# Patient Record
Sex: Male | Born: 1950
Health system: Southern US, Community
[De-identification: ages and names within clinical notes are randomized; demographics above are authoritative.]

## PROBLEM LIST (undated history)

## (undated) DIAGNOSIS — S4380XA Sprain of other specified parts of unspecified shoulder girdle, initial encounter: Secondary | ICD-10-CM

## (undated) DIAGNOSIS — I10 Essential (primary) hypertension: Secondary | ICD-10-CM

## (undated) DIAGNOSIS — K219 Gastro-esophageal reflux disease without esophagitis: Secondary | ICD-10-CM

## (undated) DIAGNOSIS — E0590 Thyrotoxicosis, unspecified without thyrotoxic crisis or storm: Secondary | ICD-10-CM

## (undated) HISTORY — PX: EYE SURGERY: SHX253

## (undated) HISTORY — DX: Gastro-esophageal reflux disease without esophagitis: K21.9

## (undated) HISTORY — DX: Essential (primary) hypertension: I10

## (undated) HISTORY — DX: Thyrotoxicosis, unspecified without thyrotoxic crisis or storm: E05.90

## (undated) HISTORY — DX: Sprain of other specified parts of unspecified shoulder girdle, initial encounter: S43.80XA

## (undated) HISTORY — PX: TONSILLECTOMY: SUR1361

---

## 2009-05-12 ENCOUNTER — Emergency Department (HOSPITAL_COMMUNITY): Admission: EM | Admit: 2009-05-12 | Discharge: 2009-05-12 | Payer: Self-pay | Admitting: Emergency Medicine

## 2010-05-03 ENCOUNTER — Emergency Department (HOSPITAL_COMMUNITY): Admission: EM | Admit: 2010-05-03 | Discharge: 2010-05-03 | Payer: Self-pay | Admitting: Emergency Medicine

## 2010-05-04 ENCOUNTER — Emergency Department (HOSPITAL_COMMUNITY): Admission: EM | Admit: 2010-05-04 | Discharge: 2010-05-04 | Payer: Self-pay | Admitting: Emergency Medicine

## 2010-10-25 LAB — URINALYSIS, ROUTINE W REFLEX MICROSCOPIC
Leukocytes, UA: NEGATIVE
Nitrite: NEGATIVE
Urobilinogen, UA: 0.2 mg/dL (ref 0.0–1.0)

## 2010-10-25 LAB — URINE MICROSCOPIC-ADD ON

## 2010-11-15 LAB — CBC
Hemoglobin: 12.1 g/dL — ABNORMAL LOW (ref 13.0–17.0)
MCHC: 34.3 g/dL (ref 30.0–36.0)
Platelets: 173 10*3/uL (ref 150–400)
RDW: 14.1 % (ref 11.5–15.5)

## 2010-11-15 LAB — DIFFERENTIAL
Basophils Absolute: 0 10*3/uL (ref 0.0–0.1)
Basophils Relative: 0 % (ref 0–1)
Eosinophils Absolute: 0.1 10*3/uL (ref 0.0–0.7)
Eosinophils Relative: 3 % (ref 0–5)
Lymphocytes Relative: 37 % (ref 12–46)
Monocytes Absolute: 0.7 10*3/uL (ref 0.1–1.0)
Monocytes Relative: 15 % — ABNORMAL HIGH (ref 3–12)
Neutrophils Relative %: 46 % (ref 43–77)

## 2010-11-15 LAB — COMPREHENSIVE METABOLIC PANEL
ALT: 29 U/L (ref 0–53)
Albumin: 3.6 g/dL (ref 3.5–5.2)
Alkaline Phosphatase: 47 U/L (ref 39–117)
Calcium: 9 mg/dL (ref 8.4–10.5)
Chloride: 104 mEq/L (ref 96–112)
Creatinine, Ser: 1.05 mg/dL (ref 0.4–1.5)
Glucose, Bld: 131 mg/dL — ABNORMAL HIGH (ref 70–99)
Potassium: 3.9 mEq/L (ref 3.5–5.1)
Sodium: 142 mEq/L (ref 135–145)
Total Bilirubin: 0.7 mg/dL (ref 0.3–1.2)

## 2010-11-15 LAB — URINALYSIS, ROUTINE W REFLEX MICROSCOPIC: Hgb urine dipstick: NEGATIVE

## 2010-11-15 LAB — LIPASE, BLOOD: Lipase: 22 U/L (ref 11–59)

## 2011-05-29 ENCOUNTER — Other Ambulatory Visit: Payer: Self-pay | Admitting: Family Medicine

## 2011-05-29 ENCOUNTER — Encounter: Payer: Self-pay | Admitting: Family Medicine

## 2011-05-29 ENCOUNTER — Ambulatory Visit
Admission: RE | Admit: 2011-05-29 | Discharge: 2011-05-29 | Disposition: A | Discharge status: Home / Self Care | Payer: BC Managed Care – PPO | Source: Ambulatory Visit | Attending: Family Medicine | Admitting: Family Medicine

## 2011-05-29 ENCOUNTER — Inpatient Hospital Stay (INDEPENDENT_AMBULATORY_CARE_PROVIDER_SITE_OTHER)
Admission: RE | Admit: 2011-05-29 | Discharge: 2011-05-29 | Disposition: A | Discharge status: Home / Self Care | Payer: BC Managed Care – PPO | Source: Ambulatory Visit | Attending: Family Medicine | Admitting: Family Medicine

## 2011-05-29 DIAGNOSIS — S9000XA Contusion of unspecified ankle, initial encounter: Secondary | ICD-10-CM

## 2011-07-15 NOTE — Progress Notes (Signed)
Summary: Right ankle Pain Room 2   Vital Signs:  Patient Profile:   60 Years Old Male CC:      Rt ankle injury, dropped mower on it x 5 days ago Height:     68 inches Weight:      165 pounds O2 Sat:      98 % O2 treatment:    Room Air Temp:     98.2 degrees F oral Pulse rate:   64 / minute Pulse rhythm:   regular Resp:     16 per minute BP sitting:   145 / 88  (left arm) Cuff size:   regular  Vitals Entered By: Emilio Math (May 29, 2011 10:37 AM)                  Current Allergies: No known allergies History of Present Illness Chief Complaint: Rt ankle injury, dropped mower on it x 4 days ago History of Present Illness:  Subjective:  Patient was working on a PTO driven mower deck 4 days ago while it was propped on its edge.  The deck fell over and struck the medial aspect of his right ankle.  He has had persistent swelling/soreness although it is improving.  He had a small abrasion at the point of contact.  His last tetanus shot was 4 to 5 years ago.  Current Meds OMEPRAZOLE 10 MG CPDR (OMEPRAZOLE)   REVIEW OF SYSTEMS       Musculoskeletal       Complains of muscle pain, joint pain, joint stiffness, and decreased range of motion.   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  Tenderness and mild swelling over the medial malleolus.  There is a superficial abrasion over the medial malleolus without drainage or evidence of infection.  Joint stable.  No tenderness over the base of the fifth  metatarsal.  Distal neurovascular intact.  x-ray right ankle:  no fracture  Assessment New Problems: CONTUSION, ANKLE (ICD-924.21)  NO EVIDENCE INFECTION TODAY  Plan New Orders: T-DG Ankle  Complete*R* [73610] Ace Wraps 3-5 in/yard  [E4540] New Patient Level III [98119] Planning Comments:   Bacitracin and bandage applied to abrasion.  Ace wrap applied to foot and ankle. Recommend changing bandage daily with Bacitracin until healed.  Wear ace wrap daytime until healed. Return for any signs of infection   The patient and/or caregiver has been counseled thoroughly with regard to medications prescribed including dosage, schedule, interactions, rationale for use, and possible side effects and they verbalize understanding.  Diagnoses and expected course of recovery discussed and will return if not improved as expected or if the condition worsens. Patient and/or caregiver verbalized understanding.   Orders Added: 1)  T-DG Ankle Complete*R* [73610] 2)  Ace Wraps 3-5 in/yard  [A6449] 3)  New Patient Level III [14782]

## 2011-07-30 ENCOUNTER — Emergency Department
Admission: EM | Admit: 2011-07-30 | Discharge: 2011-07-30 | Disposition: A | Discharge status: Home / Self Care | Payer: BC Managed Care – PPO | Source: Home / Self Care | Attending: Family Medicine | Admitting: Family Medicine

## 2011-07-30 ENCOUNTER — Emergency Department
Admit: 2011-07-30 | Discharge: 2011-07-30 | Disposition: A | Discharge status: Home / Self Care | Payer: BC Managed Care – PPO | Attending: Family Medicine | Admitting: Family Medicine

## 2011-07-30 ENCOUNTER — Encounter: Payer: Self-pay | Admitting: *Deleted

## 2011-07-30 DIAGNOSIS — M25559 Pain in unspecified hip: Secondary | ICD-10-CM

## 2011-07-30 DIAGNOSIS — M25551 Pain in right hip: Secondary | ICD-10-CM

## 2011-07-30 NOTE — ED Notes (Addendum)
Patient was seen here in [facility] a couple months ago for pain after a fall. He now has intermittent right hip pain and decreased ROM with his right hip.

## 2011-07-30 NOTE — ED Provider Notes (Signed)
History     CSN: [id number] Arrival date & time: 07/30/2011  4:19 PM   First MD Initiated Contact with Patient 07/30/11 1636      Chief Complaint  Patient presents with  . Hip Pain    right     HPI Comments: Patient injured his right ankle about two months ago when a PTO driven mower deck fell on it.  He fell to the ground, landing on his right hip and buttock.  He had no significant pain in his right hip/buttock at that time, but does recall noticing bruising in that area. Over the past two weeks he has developed stiffness in his right hip, most noticeable when he attempts to raise his right foot after a shower to towel dry his right foot.  He also has some mild discomfort when arising from a chair.  Patient is a 59 y.o. male presenting with hip pain. The history is provided by the patient.  Hip Pain This is a new problem. The current episode started more than 1 week ago. The problem occurs daily. The problem has been gradually worsening. The symptoms are aggravated by bending. The symptoms are relieved by nothing. He has tried nothing for the symptoms.    History reviewed. No pertinent past medical history.  History reviewed. No pertinent past surgical history.  History reviewed. No pertinent family history.  History  Substance Use Topics  . Smoking status: Never Smoker   . Smokeless tobacco: Not on file  . Alcohol Use: No      Review of Systems  All other systems reviewed and are negative.    Allergies  Review of patient's allergies indicates no known allergies.  Home Medications  No current outpatient prescriptions on file.  BP 148/84  Pulse 68  Temp(Src) 98.3 F (36.8 C) (Oral)  Resp 14  Ht 5\' 7"  (1.702 m)  Wt 167 lb (75.751 kg)  BMI 26.16 kg/m2  SpO2 98%  Physical Exam  Constitutional: He appears well-developed and well-nourished. No distress.  Eyes: Pupils are equal, round, and reactive to light.  Cardiovascular: Normal rate and normal heart sounds.    Pulmonary/Chest: Breath sounds normal.  Musculoskeletal:       Right hip: He exhibits decreased range of motion. He exhibits normal strength, no tenderness, no bony tenderness, no swelling, no crepitus and no deformity.       There is no tenderness to palpation in the right hip/buttock area.  There is no tenderness in the right inguinal and pubic symphysis area. Right hip has decreased internal/external rotation, but normal flexion/extension.    ED Course  Procedures  none  Narrative: *RADIOLOGY REPORT*  Clinical Data: Development of right hip pain after an injury approximately 2 months ago.  RIGHT HIP - COMPLETE 2+ VIEW 07/30/2011:  Comparison: None.  Findings: No evidence of acute or subacute fracture or dislocation. Joint space well-preserved. No intrinsic osseous abnormalities. No visible joint effusion.  Included AP pelvis demonstrates a normal appearing contralateral left hip. Sacroiliac joints and symphysis pubis intact. No fractures elsewhere involving the bony pelvis. Visualized lower lumbar spine unremarkable. Surgical clips noted in the scrotum.  IMPRESSION: Normal examination.  Original Report Authenticated By: Arnell Sieving, M.D.      1. Right hip pain       MDM  Unremarkable exam and hip x-ray today. Begin range of motion exercises ([facility] information and instruction handout given)  Followup with [facility] if not improving about two weeks.  Donna Christen, MD 08/04/11 2221

## 2011-08-05 DIAGNOSIS — M25559 Pain in unspecified hip: Secondary | ICD-10-CM

## 2013-02-12 ENCOUNTER — Emergency Department (INDEPENDENT_AMBULATORY_CARE_PROVIDER_SITE_OTHER): Payer: No Typology Code available for payment source

## 2013-02-12 ENCOUNTER — Emergency Department
Admission: EM | Admit: 2013-02-12 | Discharge: 2013-02-12 | Disposition: A | Discharge status: Home / Self Care | Payer: No Typology Code available for payment source | Source: Home / Self Care | Attending: Family Medicine | Admitting: Family Medicine

## 2013-02-12 DIAGNOSIS — S8392XA Sprain of unspecified site of left knee, initial encounter: Secondary | ICD-10-CM

## 2013-02-12 DIAGNOSIS — IMO0002 Reserved for concepts with insufficient information to code with codable children: Secondary | ICD-10-CM

## 2013-02-12 DIAGNOSIS — X500XXA Overexertion from strenuous movement or load, initial encounter: Secondary | ICD-10-CM

## 2013-02-12 DIAGNOSIS — M25469 Effusion, unspecified knee: Secondary | ICD-10-CM

## 2013-02-12 MED ORDER — HYDROCODONE-ACETAMINOPHEN 5-325 MG PO TABS: ORAL_TABLET | ORAL | Status: DC

## 2013-02-12 NOTE — ED Provider Notes (Signed)
History    CSN: [id number] Arrival date & time 02/12/13  2725  First MD Initiated Contact with Patient 02/12/13 [phone]     Chief Complaint  Patient presents with  . Knee Pain    x 1 day      HPI Comments: Patient complains of recurring pain in his right knee for about a year.  Last night while playing tag with grandkids, he fell and twisted his right knee.  He is having persistent pain medially.  Patient is a 62 y.o. male presenting with knee pain. The history is provided by the patient.  Knee Pain Location:  Knee Time since incident:  1 day Injury: yes   Mechanism of injury: fall   Fall:    Fall occurred:  Recreating/playing   Impact surface:  Grass   Point of impact:  Unable to specify Knee location:  L knee Pain details:    Quality:  Sharp   Radiates to:  Does not radiate   Severity:  Moderate   Onset quality:  Sudden   Duration:  1 day   Timing:  Constant   Progression:  Unchanged Chronicity:  New Dislocation: no   Prior injury to area:  No Relieved by:  Nothing Worsened by:  Bearing weight Ineffective treatments:  NSAIDs Associated symptoms: decreased ROM and stiffness   Associated symptoms: no back pain, no muscle weakness, no numbness, no swelling and no tingling    History reviewed. No pertinent past medical history. History reviewed. No pertinent past surgical history. History reviewed. No pertinent family history. History  Substance Use Topics  . Smoking status: Never Smoker   . Smokeless tobacco: Not on file  . Alcohol Use: No    Review of Systems  Musculoskeletal: Positive for stiffness. Negative for back pain.  All other systems reviewed and are negative.    Allergies  Review of patient's allergies indicates no known allergies.  Home Medications   Current Outpatient Rx  Name  Route  Sig  Dispense  Refill  . omeprazole (PRILOSEC) 20 MG capsule   Oral   Take 20 mg by mouth daily.         Marland Kitchen HYDROcodone-acetaminophen (NORCO/VICODIN) 5-325  MG per tablet      Take one by mouth at bedtime as needed for pain   10 tablet   0    BP 159/92  Pulse 84  Temp(Src) 98 F (36.7 C) (Oral)  Ht 5' 7.5" (1.715 m)  Wt 165 lb (74.844 kg)  BMI 25.45 kg/m2  SpO2 95% Physical Exam  Nursing note and vitals reviewed. Constitutional: He is oriented to person, place, and time. He appears well-developed and well-nourished. No distress.  HENT:  Head: Atraumatic.  Eyes: Conjunctivae are normal. Pupils are equal, round, and reactive to light.  Musculoskeletal: He exhibits tenderness.       Left knee: He exhibits decreased range of motion, bony tenderness and abnormal meniscus. He exhibits no swelling, no effusion, no ecchymosis, no deformity, no laceration, no erythema, normal alignment, no LCL laxity, normal patellar mobility and no MCL laxity. Tenderness found. Medial joint line and MCL tenderness noted. No lateral joint line, no LCL and no patellar tendon tenderness noted.  Left knee is stable.  There is distinct tenderness over the medial joint line and MCL.  McMurray test is questionably positive for medial meniscus.  Distal neurovascular function is intact.   Neurological: He is alert and oriented to person, place, and time.  Skin: Skin is warm and  dry. No erythema.    ED Course  Procedures  none Labs Reviewed -       DG Knee Complete 4 Views Left (Final result)  Result time: 02/12/13 10:26:57    Final result by Rad Results In Interface (02/12/13 10:26:57)    Narrative:   *RADIOLOGY REPORT*  Clinical Data: Left knee pain. Twisting injury.  LEFT KNEE - COMPLETE 4+ VIEW  Comparison: None.  Findings: Small left knee effusion suspected on the lateral projection. Mild patellar spurring noted. There is mild spurring of the tibial spine and medial tibial plateau.  No discrete fracture identified.  IMPRESSION:  1. Small knee effusion without discrete fracture identified. If symptoms persist despite conservative therapy, MRI  followup may be warranted.   Original Report Authenticated By: Gaylyn Rong, M.D.     1. Left knee sprain, initial encounter; ?MCL sprain, ?meniscus injury     MDM  Dispensed crutches. Use crutches for about 5 to 7 days.  Wear ace wrap (patient has one at home).  Apply ice pack for 30 to 45 minutes every 1 to 4 hours.  Continue until swelling decreases.  May take Aleve, two tabs every 12 hours.  Begin knee exercises as per instruction sheet ([facility] information and instruction handout given)  Rx written for Lortab at bedtime. Follow-up with Dr. Tyson Babinski, MD 02/14/13 2220

## 2013-02-12 NOTE — Discharge Instructions (Signed)
Use crutches for about 5 to 7 days.  Wear ace wrap.  Apply ice pack for 30 to 45 minutes every 1 to 4 hours.  Continue until swelling decreases.  May take Aleve, two tabs every 12 hours.  Begin knee exercises as per instruction sheet.

## 2013-02-12 NOTE — ED Notes (Signed)
Robert West complains of left knee pain for 1 day. The pain is a 10/10 and is a stabbing - aching pain. He was playing with his grand kids yesterday and believes he twisted his knee.

## 2013-02-18 ENCOUNTER — Telehealth: Payer: Self-pay | Admitting: Emergency Medicine

## 2013-04-15 ENCOUNTER — Encounter: Payer: Self-pay | Admitting: Sports Medicine

## 2013-04-15 ENCOUNTER — Ambulatory Visit (INDEPENDENT_AMBULATORY_CARE_PROVIDER_SITE_OTHER): Payer: No Typology Code available for payment source | Admitting: Sports Medicine

## 2013-04-15 VITALS — BP 146/85 | HR 74 | Wt 163.0 lb

## 2013-04-15 DIAGNOSIS — M25562 Pain in left knee: Secondary | ICD-10-CM

## 2013-04-15 DIAGNOSIS — M170 Bilateral primary osteoarthritis of knee: Secondary | ICD-10-CM | POA: Insufficient documentation

## 2013-04-15 DIAGNOSIS — M25569 Pain in unspecified knee: Secondary | ICD-10-CM

## 2013-04-15 NOTE — Progress Notes (Signed)
   Subjective:    I'm seeing this patient as a consultation for:  Dr. Cathren Harsh  CC: Left knee pain  HPI: This is a pleasant 62 year old male, unfortunately over July 4 weekend he twisted his left knee, he had a swelling, but did develop pain for the next few days. He rested the knee and wore a brace for approximately 2 months but unfortunately has continued to have pain he localizes along the medial joint line, without radiation, no swelling, no locking, no other mechanical symptoms. He tried some oral analgesics without any improvement. He was seen in urgent care where x-rays showed a mild effusion. He was referred to me for definitive evaluation and treatment.  Past medical history, Surgical history, Family history not pertinant except as noted below, Social history, Allergies, and medications have been entered into the medical record, reviewed, and no changes needed.   Review of Systems: No headache, visual changes, nausea, vomiting, diarrhea, constipation, dizziness, abdominal pain, skin rash, fevers, chills, night sweats, weight loss, swollen lymph nodes, body aches, joint swelling, muscle aches, chest pain, shortness of breath, mood changes, visual or auditory hallucinations.   Objective:   General: Well Developed, well nourished, and in no acute distress.  Neuro/Psych: Alert and oriented x3, extra-ocular muscles intact, able to move all 4 extremities, sensation grossly intact. Skin: Warm and dry, no rashes noted.  Respiratory: Not using accessory muscles, speaking in full sentences, trachea midline.  Cardiovascular: Pulses palpable, no extremity edema. Abdomen: Does not appear distended. Left Knee: Normal to inspection with no erythema or effusion or obvious bony abnormalities. Tender to palpation at the medial joint line. ROM full in flexion and extension and lower leg rotation. Ligaments with solid consistent endpoints including ACL, PCL, LCL, MCL. Negative Mcmurray's, Apley's, and  Thessalonian tests. Pain with extreme flexion suggesting meniscal injury. Non painful patellar compression. Patellar glide without crepitus. Patellar and quadriceps tendons unremarkable. Hamstring and quadriceps strength is normal.   Procedure: Real-time Ultrasound Guided Injection of left knee Device: GE Logiq E  Verbal informed consent obtained.  Time-out conducted.  Noted no overlying erythema, induration, or other signs of local infection.  Skin prepped in a sterile fashion.  Local anesthesia: Topical Ethyl chloride.  With sterile technique and under real time ultrasound guidance:  25-gauge needle advanced in the suprapatellar recess, 2 cc Kenalog 40, 4 cc lidocaine injected easily. Completed without difficulty  Pain immediately resolved suggesting accurate placement of the medication.  Advised to call if fevers/chills, erythema, induration, drainage, or persistent bleeding.  Images permanently stored and available for review in the ultrasound unit.  Impression: Technically successful ultrasound guided injection.  Impression and Recommendations:   This case required medical decision making of moderate complexity.

## 2013-04-15 NOTE — Assessment & Plan Note (Signed)
This likely represents a medial meniscal tear. He has failed almost 2 months of conservative measures. No mechanical symptoms. Injected as above. He will wear his knee brace, and do some home exercises. I'd like to back in one month.

## 2013-05-14 ENCOUNTER — Ambulatory Visit: Payer: No Typology Code available for payment source | Admitting: Sports Medicine

## 2013-07-22 ENCOUNTER — Ambulatory Visit (INDEPENDENT_AMBULATORY_CARE_PROVIDER_SITE_OTHER): Payer: No Typology Code available for payment source | Admitting: Sports Medicine

## 2013-07-22 ENCOUNTER — Encounter: Payer: Self-pay | Admitting: Sports Medicine

## 2013-07-22 VITALS — BP 145/84 | HR 85 | Wt 168.0 lb

## 2013-07-22 DIAGNOSIS — M25569 Pain in unspecified knee: Secondary | ICD-10-CM

## 2013-07-22 DIAGNOSIS — M25562 Pain in left knee: Secondary | ICD-10-CM

## 2013-07-22 MED ORDER — TRAMADOL HCL 50 MG PO TABS: ORAL_TABLET | ORAL | Status: DC

## 2013-07-22 NOTE — Progress Notes (Signed)
  Subjective:    CC: Recheck knee  HPI: I last saw Robert West about 3 months ago, we injected his knee, he had what appeared to be a degenerative meniscal tear. He had at least one month of response but unfortunately the pain is coming back. It is localized to the medial joint line without mechanical symptoms, moderate, persistent. NSAIDs are ineffective. His insurance is changing, and he'll be unable to have any advanced imaging or surgery until March of 2015.  Past medical history, Surgical history, Family history not pertinant except as noted below, Social history, Allergies, and medications have been entered into the medical record, reviewed, and no changes needed.   Review of Systems: No fevers, chills, night sweats, weight loss, chest pain, or shortness of breath.   Objective:    General: Well Developed, well nourished, and in no acute distress.  Neuro: Alert and oriented x3, extra-ocular muscles intact, sensation grossly intact.  HEENT: Normocephalic, atraumatic, pupils equal round reactive to light, neck supple, no masses, no lymphadenopathy, thyroid nonpalpable.  Skin: Warm and dry, no rashes. Cardiac: Regular rate and rhythm, no murmurs rubs or gallops, no lower extremity edema.  Respiratory: Clear to auscultation bilaterally. Not using accessory muscles, speaking in full sentences. Left Knee: Normal to inspection with no erythema or effusion or obvious bony abnormalities. Tender to palpation at the medial joint line. ROM full in flexion and extension and lower leg rotation. Ligaments with solid consistent endpoints including ACL, PCL, LCL, MCL. Negative Mcmurray's, Apley's, and Thessalonian tests. Non painful patellar compression. Patellar glide without crepitus. Patellar and quadriceps tendons unremarkable. Hamstring and quadriceps strength is normal.   Procedure:  Injection of left knee Consent obtained and verified. Time-out conducted. Noted no overlying erythema,  induration, or other signs of local infection. Skin prepped in a sterile fashion. Topical analgesic spray: Ethyl chloride. Completed without difficulty. Meds: 1 cc Depo-Medrol 40, 2 cc lidocaine, 2 cc Marcaine injected easily into the suprapatellar recess. Pain immediately improved suggesting accurate placement of the medication. Advised to call if fevers/chills, erythema, induration, drainage, or persistent bleeding.  Impression and Recommendations:

## 2013-07-22 NOTE — Assessment & Plan Note (Signed)
This does likely represent a degenerative meniscal tear. Unfortunately their new insurance does not start until March. I do think he needs an MRI, and arthroscopy. We did inject the knee today with Depo-Medrol and Marcaine for comfort. Tramadol as needed for pain. He will come back to see me at the end of March/early April for MRI and likely referral to orthopedics.

## 2013-11-22 ENCOUNTER — Ambulatory Visit: Payer: No Typology Code available for payment source | Admitting: Sports Medicine

## 2014-07-04 ENCOUNTER — Ambulatory Visit (INDEPENDENT_AMBULATORY_CARE_PROVIDER_SITE_OTHER): Payer: BC Managed Care – PPO | Admitting: Sports Medicine

## 2014-07-04 ENCOUNTER — Encounter: Payer: Self-pay | Admitting: Sports Medicine

## 2014-07-04 VITALS — BP 143/77 | HR 65 | Ht 67.25 in | Wt 158.0 lb

## 2014-07-04 DIAGNOSIS — M25562 Pain in left knee: Secondary | ICD-10-CM

## 2014-07-04 DIAGNOSIS — R030 Elevated blood-pressure reading, without diagnosis of hypertension: Secondary | ICD-10-CM

## 2014-07-04 DIAGNOSIS — Z0000 Encounter for general adult medical examination without abnormal findings: Secondary | ICD-10-CM | POA: Insufficient documentation

## 2014-07-04 DIAGNOSIS — Z23 Encounter for immunization: Secondary | ICD-10-CM | POA: Diagnosis not present

## 2014-07-04 DIAGNOSIS — I10 Essential (primary) hypertension: Secondary | ICD-10-CM | POA: Insufficient documentation

## 2014-07-04 DIAGNOSIS — R7989 Other specified abnormal findings of blood chemistry: Secondary | ICD-10-CM

## 2014-07-04 HISTORY — DX: Essential (primary) hypertension: I10

## 2014-07-04 NOTE — Assessment & Plan Note (Signed)
Resolved after injection one year ago.

## 2014-07-04 NOTE — Addendum Note (Signed)
Addended by: Chalmers CaterUTTLE, Roshard Rezabek H on: 07/04/2014 11:14 AM   Modules accepted: Orders, Medications

## 2014-07-04 NOTE — Assessment & Plan Note (Signed)
Return in 2 weeks for a nurse blood pressure check.

## 2014-07-04 NOTE — Progress Notes (Signed)
  Subjective:    CC: Establish care.   HPI:  This is a pleasant 63 year old male comes in to establish care, he has no complaints.  We injected his knee approximately a year ago, pain-free.  Past medical history, Surgical history, Family history not pertinant except as noted below, Social history, Allergies, and medications have been entered into the medical record, reviewed, and no changes needed.   Review of Systems: No headache, visual changes, nausea, vomiting, diarrhea, constipation, dizziness, abdominal pain, skin rash, fevers, chills, night sweats, swollen lymph nodes, weight loss, chest pain, body aches, joint swelling, muscle aches, shortness of breath, mood changes, visual or auditory hallucinations.  Objective:    General: Well Developed, well nourished, and in no acute distress.  Neuro: Alert and oriented x3, extra-ocular muscles intact, sensation grossly intact. Cranial nerves II through XII are intact, motor, sensory, and coordinative functions are all intact. HEENT: Normocephalic, atraumatic, pupils equal round reactive to light, neck supple, no masses, no lymphadenopathy, thyroid nonpalpable. Oropharynx, nasopharynx, external ear canals are unremarkable. Skin: Warm and dry, no rashes noted.  Cardiac: Regular rate and rhythm, no murmurs rubs or gallops.  Respiratory: Clear to auscultation bilaterally. Not using accessory muscles, speaking in full sentences.  Abdominal: Soft, nontender, nondistended, positive bowel sounds, no masses, no organomegaly.  Musculoskeletal: Shoulder, elbow, wrist, hip, knee, ankle stable, and with full range of motion.  Impression and Recommendations:    The patient was counselled, risk factors were discussed, anticipatory guidance given.

## 2014-07-04 NOTE — Assessment & Plan Note (Signed)
Updating vaccinations. Checking routine blood work.

## 2014-07-05 LAB — CBC
HCT: 41.8 % (ref 39.0–52.0)
Hemoglobin: 14.3 g/dL (ref 13.0–17.0)
MCH: 31.8 pg (ref 26.0–34.0)
MCHC: 34.2 g/dL (ref 30.0–36.0)
MCV: 92.9 fL (ref 78.0–100.0)
MPV: 10.1 fL (ref 9.4–12.4)
Platelets: 244 10*3/uL (ref 150–400)
RBC: 4.5 MIL/uL (ref 4.22–5.81)
RDW: 14.1 % (ref 11.5–15.5)
WBC: 4.9 10*3/uL (ref 4.0–10.5)

## 2014-07-05 LAB — COMPREHENSIVE METABOLIC PANEL WITH GFR
AST: 20 U/L (ref 0–37)
BUN: 17 mg/dL (ref 6–23)
CO2: 27 meq/L (ref 19–32)
Chloride: 104 meq/L (ref 96–112)
Creat: 0.88 mg/dL (ref 0.50–1.35)
Glucose, Bld: 80 mg/dL (ref 70–99)
Potassium: 4.6 meq/L (ref 3.5–5.3)
Sodium: 138 meq/L (ref 135–145)
Total Protein: 6.6 g/dL (ref 6.0–8.3)

## 2014-07-05 LAB — LIPID PANEL
Cholesterol: 162 mg/dL (ref 0–200)
HDL: 42 mg/dL (ref >39)
LDL Cholesterol: 97 mg/dL (ref 0–99)
Total CHOL/HDL Ratio: 3.9 Ratio
Triglycerides: 113 mg/dL (ref <150)
VLDL: 23 mg/dL (ref 0–40)

## 2014-07-05 LAB — HEMOGLOBIN A1C
Hgb A1c MFr Bld: 5.6 % (ref <5.7)
Mean Plasma Glucose: 114 mg/dL (ref <117)

## 2014-07-05 LAB — COMPREHENSIVE METABOLIC PANEL
ALT: 21 U/L (ref 0–53)
Albumin: 4.3 g/dL (ref 3.5–5.2)
Alkaline Phosphatase: 57 U/L (ref 39–117)
Calcium: 9.1 mg/dL (ref 8.4–10.5)
Total Bilirubin: 0.5 mg/dL (ref 0.2–1.2)

## 2014-07-05 LAB — TSH: TSH: 0.219 u[IU]/mL — ABNORMAL LOW (ref 0.350–4.500)

## 2014-07-06 DIAGNOSIS — E0590 Thyrotoxicosis, unspecified without thyrotoxic crisis or storm: Secondary | ICD-10-CM | POA: Insufficient documentation

## 2014-07-06 HISTORY — DX: Thyrotoxicosis, unspecified without thyrotoxic crisis or storm: E05.90

## 2014-07-06 NOTE — Assessment & Plan Note (Signed)
In the absence of thyroid medication supplementation, this likely represents some clinical hyperthyroidism.  Adding T3, T4, thyroid stimulating immunoglobulins.

## 2014-07-06 NOTE — Addendum Note (Signed)
Addended by: Monica BectonHEKKEKANDAM, THOMAS J on: 07/06/2014 09:39 AM   Modules accepted: Orders

## 2014-07-13 ENCOUNTER — Telehealth: Payer: Self-pay

## 2014-07-13 NOTE — Telephone Encounter (Signed)
Still waiting, have they been added or if they are unable to be added has he gone to have them drawn again?

## 2014-07-13 NOTE — Telephone Encounter (Signed)
Patient called wanted to know if  Additional lab results were back. Rhonda Cunningham,CMA

## 2014-07-15 NOTE — Telephone Encounter (Signed)
Yes labs have been added to order. Rhonda Cunningham,CMA

## 2014-08-15 ENCOUNTER — Ambulatory Visit (INDEPENDENT_AMBULATORY_CARE_PROVIDER_SITE_OTHER): Payer: 59 | Admitting: Sports Medicine

## 2014-08-15 VITALS — BP 142/77 | HR 83 | Wt 161.0 lb

## 2014-08-15 DIAGNOSIS — I10 Essential (primary) hypertension: Secondary | ICD-10-CM

## 2014-08-15 DIAGNOSIS — R7989 Other specified abnormal findings of blood chemistry: Secondary | ICD-10-CM

## 2014-08-15 MED ORDER — LISINOPRIL 10 MG PO TABS: 10.0000 mg | ORAL_TABLET | Freq: Every day | ORAL | Status: DC

## 2014-08-15 NOTE — Assessment & Plan Note (Signed)
Still has not obtained follow-up tests for a low TSH.

## 2014-08-15 NOTE — Assessment & Plan Note (Signed)
Adding lisinopril 10 mg. Return in 2 weeks.

## 2014-08-15 NOTE — Progress Notes (Signed)
I spoke with Robert West making him aware of medication sent to pharmacy. He verbalized understanding. He was counseled on medication also. He was reminded of outstanding blood work and he said that he would get the lab slip on Thursday when he came in for office visit for his shoulder.

## 2014-08-15 NOTE — Progress Notes (Signed)
   Subjective:    Patient ID: Robert West, male    DOB: 22-Mar-1951, 64 y.o.   MRN: [id number]  HPI  Robert West reports to office today for BP check. He denies headache or dizziness at this time. No other concerns.    Review of Systems     Objective:   Physical Exam        Assessment & Plan:

## 2014-08-18 ENCOUNTER — Ambulatory Visit (INDEPENDENT_AMBULATORY_CARE_PROVIDER_SITE_OTHER): Payer: 59

## 2014-08-18 ENCOUNTER — Other Ambulatory Visit: Payer: Self-pay | Admitting: Sports Medicine

## 2014-08-18 ENCOUNTER — Encounter: Payer: Self-pay | Admitting: Sports Medicine

## 2014-08-18 ENCOUNTER — Ambulatory Visit (INDEPENDENT_AMBULATORY_CARE_PROVIDER_SITE_OTHER): Payer: 59 | Admitting: Sports Medicine

## 2014-08-18 VITALS — BP 113/65 | HR 73 | Ht 67.0 in | Wt 161.0 lb

## 2014-08-18 DIAGNOSIS — S4382XA Sprain of other specified parts of left shoulder girdle, initial encounter: Secondary | ICD-10-CM

## 2014-08-18 DIAGNOSIS — R946 Abnormal results of thyroid function studies: Secondary | ICD-10-CM

## 2014-08-18 DIAGNOSIS — S46819A Strain of other muscles, fascia and tendons at shoulder and upper arm level, unspecified arm, initial encounter: Secondary | ICD-10-CM | POA: Insufficient documentation

## 2014-08-18 DIAGNOSIS — S46812A Strain of other muscles, fascia and tendons at shoulder and upper arm level, left arm, initial encounter: Secondary | ICD-10-CM

## 2014-08-18 DIAGNOSIS — M25512 Pain in left shoulder: Secondary | ICD-10-CM

## 2014-08-18 DIAGNOSIS — R7989 Other specified abnormal findings of blood chemistry: Secondary | ICD-10-CM

## 2014-08-18 DIAGNOSIS — E0590 Thyrotoxicosis, unspecified without thyrotoxic crisis or storm: Secondary | ICD-10-CM

## 2014-08-18 DIAGNOSIS — S4380XA Sprain of other specified parts of unspecified shoulder girdle, initial encounter: Secondary | ICD-10-CM | POA: Insufficient documentation

## 2014-08-18 DIAGNOSIS — I10 Essential (primary) hypertension: Secondary | ICD-10-CM

## 2014-08-18 HISTORY — DX: Strain of other muscles, fascia and tendons at shoulder and upper arm level, unspecified arm, initial encounter: S46.819A

## 2014-08-18 LAB — COMPREHENSIVE METABOLIC PANEL WITH GFR
Alkaline Phosphatase: 58 U/L (ref 39–117)
BUN: 19 mg/dL (ref 6–23)
CO2: 29 meq/L (ref 19–32)
Chloride: 104 meq/L (ref 96–112)
Creat: 0.92 mg/dL (ref 0.50–1.35)
Glucose, Bld: 82 mg/dL (ref 70–99)
Sodium: 140 meq/L (ref 135–145)
Total Bilirubin: 0.6 mg/dL (ref 0.2–1.2)

## 2014-08-18 LAB — COMPREHENSIVE METABOLIC PANEL
ALT: 24 U/L (ref 0–53)
AST: 20 U/L (ref 0–37)
Albumin: 4.5 g/dL (ref 3.5–5.2)
Calcium: 9.8 mg/dL (ref 8.4–10.5)
Potassium: 4.6 mEq/L (ref 3.5–5.3)
Total Protein: 7.1 g/dL (ref 6.0–8.3)

## 2014-08-18 LAB — CBC
HCT: 43 % (ref 39.0–52.0)
Hemoglobin: 14.6 g/dL (ref 13.0–17.0)
MCH: 32 pg (ref 26.0–34.0)
MCHC: 34 g/dL (ref 30.0–36.0)
MCV: 94.3 fL (ref 78.0–100.0)
MPV: 10.2 fL (ref 8.6–12.4)
Platelets: 226 K/uL (ref 150–400)
RBC: 4.56 MIL/uL (ref 4.22–5.81)
RDW: 14.8 % (ref 11.5–15.5)
WBC: 5.4 10*3/uL (ref 4.0–10.5)

## 2014-08-18 LAB — T3, FREE: T3, Free: 3.7 pg/mL (ref 2.3–4.2)

## 2014-08-18 LAB — TSH: TSH: 0.292 u[IU]/mL — ABNORMAL LOW (ref 0.350–4.500)

## 2014-08-18 LAB — T4, FREE: Free T4: 0.97 ng/dL (ref 0.80–1.80)

## 2014-08-18 MED ORDER — MAGNESIUM OXIDE 400 MG PO TABS: 800.0000 mg | ORAL_TABLET | Freq: Every day | ORAL | Status: DC

## 2014-08-18 NOTE — Assessment & Plan Note (Signed)
Occurred after a fall 6 months ago. Significant weakness to internal rotation. Ultrasound does show partial tearing of the subscapularis and supraspinatus. Formal physical therapy. As this may represent a surgical injury we are going to proceed more quickly with MRI. Return to go over MRI results.

## 2014-08-18 NOTE — Assessment & Plan Note (Addendum)
Never had follow-up confirmatory testing.  Rechecking TSH, T3, T4, thyroglobulin antibodies.  All of the above testing is normal, this likely represents subclinical hyperthyroidism, no further workup needed.

## 2014-08-18 NOTE — Progress Notes (Signed)
  Subjective:    CC: Left shoulder injury  HPI: 6 months ago this pleasant 64 year old male had a fall, cough himself at the left shoulder but then had immediate pain and inability to use it. He never presented to a physician however has continued to have pain that he localizes anteriorly and laterally, with great difficulty and internal rotation. Pain is severe, persistent.  Hypertension: Well controlled.  Low TSH: Has not had confirmatory testing done yet.  Past medical history, Surgical history, Family history not pertinant except as noted below, Social history, Allergies, and medications have been entered into the medical record, reviewed, and no changes needed.   Review of Systems: No fevers, chills, night sweats, weight loss, chest pain, or shortness of breath.   Objective:    General: Well Developed, well nourished, and in no acute distress.  Neuro: Alert and oriented x3, extra-ocular muscles intact, sensation grossly intact.  HEENT: Normocephalic, atraumatic, pupils equal round reactive to light, neck supple, no masses, no lymphadenopathy, thyroid nonpalpable.  Skin: Warm and dry, no rashes. Cardiac: Regular rate and rhythm, no murmurs rubs or gallops, no lower extremity edema.  Respiratory: Clear to auscultation bilaterally. Not using accessory muscles, speaking in full sentences. Left Shoulder: Inspection reveals no abnormalities, atrophy or asymmetry. Palpation is normal with no tenderness over AC joint or bicipital groove. ROM is full in all planes. Rotator cuff strength extremely weak to internal rotation with a positive lift off test Mildly positive Neer and Hawkin's tests, empty can. Speeds and Yergason's tests normal. No labral pathology noted with negative Obrien's, positive crank, negative clunk, and good stability. Normal scapular function observed. No painful arc and no drop arm sign. No apprehension sign  Procedure: Diagnostic Ultrasound of  left  shoulder Device: GE Logiq E  Findings: There are hypoechoic changes through the substance of and the insertion of the subscapularis suggesting partial thickness tearing. This is also seen in the supraspinatus. Images permanently stored and available for review in the ultrasound unit.  Impression: Partial thickness tears of the supraspinatus and subscapularis without retraction.  Impression and Recommendations:

## 2014-08-18 NOTE — Assessment & Plan Note (Signed)
Well controlled. No changes. He is getting some cramping. We will add magnesium at bedtime, if no improvement we will investigate his lumbar spine.

## 2014-08-19 LAB — THYROGLOBULIN ANTIBODY PANEL
Thyroglobulin Ab: <1 IU/mL (ref <2)
Thyroglobulin: 12.9 ng/mL (ref 2.8–40.9)
Thyroperoxidase Ab SerPl-aCnc: 1 IU/mL (ref <9)

## 2014-08-22 ENCOUNTER — Telehealth: Payer: Self-pay | Admitting: *Deleted

## 2014-08-22 LAB — THYROID STIMULATING IMMUNOGLOBULIN: TSI: 42 %{baseline} (ref <140)

## 2014-08-22 NOTE — Telephone Encounter (Signed)
MRI inititiated for left shoulder via [facility] awaiting decision.

## 2014-09-06 ENCOUNTER — Telehealth: Payer: Self-pay

## 2014-09-06 NOTE — Telephone Encounter (Signed)
[facility]   [facility] and got [facility] for the MRI left shoulder without contrast. Approval number is [id number]

## 2014-09-07 ENCOUNTER — Other Ambulatory Visit: Payer: Self-pay | Admitting: Sports Medicine

## 2014-09-07 ENCOUNTER — Telehealth: Payer: Self-pay

## 2014-09-07 DIAGNOSIS — Z1389 Encounter for screening for other disorder: Secondary | ICD-10-CM

## 2014-09-07 DIAGNOSIS — Z9889 Other specified postprocedural states: Secondary | ICD-10-CM

## 2014-09-07 NOTE — Telephone Encounter (Signed)
PA for MRI Shoulder left without contrast approved. [id number]

## 2014-09-12 ENCOUNTER — Ambulatory Visit (INDEPENDENT_AMBULATORY_CARE_PROVIDER_SITE_OTHER): Payer: 59

## 2014-09-12 DIAGNOSIS — M19012 Primary osteoarthritis, left shoulder: Secondary | ICD-10-CM

## 2014-09-12 DIAGNOSIS — M75102 Unspecified rotator cuff tear or rupture of left shoulder, not specified as traumatic: Secondary | ICD-10-CM

## 2014-09-12 DIAGNOSIS — Z9889 Other specified postprocedural states: Secondary | ICD-10-CM

## 2014-09-12 DIAGNOSIS — S4382XA Sprain of other specified parts of left shoulder girdle, initial encounter: Secondary | ICD-10-CM

## 2014-09-12 DIAGNOSIS — S46812A Strain of other muscles, fascia and tendons at shoulder and upper arm level, left arm, initial encounter: Secondary | ICD-10-CM

## 2014-09-12 DIAGNOSIS — Z1389 Encounter for screening for other disorder: Secondary | ICD-10-CM

## 2014-09-15 ENCOUNTER — Encounter: Payer: Self-pay | Admitting: Sports Medicine

## 2014-09-15 ENCOUNTER — Ambulatory Visit (INDEPENDENT_AMBULATORY_CARE_PROVIDER_SITE_OTHER): Payer: 59

## 2014-09-15 ENCOUNTER — Ambulatory Visit (INDEPENDENT_AMBULATORY_CARE_PROVIDER_SITE_OTHER): Payer: 59 | Admitting: Sports Medicine

## 2014-09-15 VITALS — BP 161/82 | HR 88 | Ht 67.5 in | Wt 162.0 lb

## 2014-09-15 DIAGNOSIS — R208 Other disturbances of skin sensation: Secondary | ICD-10-CM

## 2014-09-15 DIAGNOSIS — R059 Cough, unspecified: Secondary | ICD-10-CM | POA: Insufficient documentation

## 2014-09-15 DIAGNOSIS — S46812A Strain of other muscles, fascia and tendons at shoulder and upper arm level, left arm, initial encounter: Secondary | ICD-10-CM

## 2014-09-15 DIAGNOSIS — R1032 Left lower quadrant pain: Secondary | ICD-10-CM

## 2014-09-15 DIAGNOSIS — E0590 Thyrotoxicosis, unspecified without thyrotoxic crisis or storm: Secondary | ICD-10-CM

## 2014-09-15 DIAGNOSIS — R200 Anesthesia of skin: Secondary | ICD-10-CM

## 2014-09-15 DIAGNOSIS — I10 Essential (primary) hypertension: Secondary | ICD-10-CM

## 2014-09-15 DIAGNOSIS — M5442 Lumbago with sciatica, left side: Secondary | ICD-10-CM

## 2014-09-15 DIAGNOSIS — R05 Cough: Secondary | ICD-10-CM

## 2014-09-15 DIAGNOSIS — S4382XA Sprain of other specified parts of left shoulder girdle, initial encounter: Secondary | ICD-10-CM

## 2014-09-15 NOTE — Assessment & Plan Note (Signed)
T3 and T4 were unremarkable, this suggests subclinical hyperthyroidism which does not need any further evaluation, we can recheck thyroid function in a year.

## 2014-09-15 NOTE — Assessment & Plan Note (Signed)
This likely represents acute bronchitis, no lower respiratory symptoms, no wheeze. Symptoms are likely upper respiratory, recommend Delsym, and watchful waiting.

## 2014-09-15 NOTE — Progress Notes (Addendum)
  Subjective:    CC: Follow-up  HPI: Left shoulder: After a fall and an injury, he had extreme weakness to abduction and internal rotation, and an in office ultrasound showed high-grade partial thickness tears of the supraspinatus and subscapularis. We obtained an MRI for confirmation, and he is here to follow up the results. Pain is improving but he still has significant weakness. His son was recently operated on by Dr. Ave Filterhandler.  Subclinical hyperthyroidism: Normal T3 and T4, asymptomatic.  Left anterior thigh numbness: Present for several weeks now, no back pain, symptoms are mild, persistent. We tried oral magnesium at the last visit but no improvement or change in symptoms.  Cough: Present for the past week or so, mild wheeze per patient, bringing up yellowish sputum, no fevers or chills or other constitutional symptoms.  Past medical history, Surgical history, Family history not pertinant except as noted below, Social history, Allergies, and medications have been entered into the medical record, reviewed, and no changes needed.   Review of Systems: No fevers, chills, night sweats, weight loss, chest pain, or shortness of breath.   Objective:    General: Well Developed, well nourished, and in no acute distress.  Neuro: Alert and oriented x3, extra-ocular muscles intact, sensation grossly intact.  HEENT: Normocephalic, atraumatic, pupils equal round reactive to light, neck supple, no masses, no lymphadenopathy, thyroid nonpalpable.  Skin: Warm and dry, no rashes. Cardiac: Regular rate and rhythm, no murmurs rubs or gallops, no lower extremity edema.  Respiratory: Clear to auscultation bilaterally. Not using accessory muscles, speaking in full sentences. Left shoulder: Significantly weak to abduction and external rotation with mild pain.  MRI shows type III acromion, as well as high-grade partial thickness tears of the supraspinatus and subscapularis.  Impression and Recommendations:     I spent 40 minutes with this patient, greater than 50% was face-to-face time counseling regarding the multiple below diagnoses

## 2014-09-15 NOTE — Assessment & Plan Note (Signed)
Ultrasound an MRI do confirm partial thickness tears of the supraspinatus and subscapularis, 75% tear of the supraspinatus, and partial tears of the subscapularis. This is posttraumatic so I do think he needs surgical evaluation sooner rather than later, we are also not going to do any injections until he has seen Dr. Ave Filterhandler.

## 2014-09-15 NOTE — Assessment & Plan Note (Signed)
Blood pressure is elevated today, this is likely the result of caffeine ingestion prior. We will keep an eye on this at every visit.

## 2014-09-15 NOTE — Telephone Encounter (Signed)
PA approved and MRI resulted.

## 2014-09-15 NOTE — Assessment & Plan Note (Signed)
Numbness along the medial aspect of the left thigh suggestive of lumbar radiculopathy. Most likely L2 or L3. We are going to obtain lumbar spine x-rays, further evaluation will depend on what I see, it will likely mean aggressive formal therapy of his lumbar spine.

## 2014-10-05 ENCOUNTER — Telehealth: Payer: Self-pay

## 2014-10-05 DIAGNOSIS — I10 Essential (primary) hypertension: Secondary | ICD-10-CM

## 2014-10-05 MED ORDER — VALSARTAN 160 MG PO TABS: 160.0000 mg | ORAL_TABLET | Freq: Every day | ORAL | Status: DC

## 2014-10-05 NOTE — Telephone Encounter (Signed)
Yes, definitely, I will switch him to valsartan and.

## 2014-10-05 NOTE — Telephone Encounter (Signed)
Spoke to patient gave him information as noted below. Mukund Weinreb,CMA  

## 2014-10-05 NOTE — Telephone Encounter (Signed)
Patient called stated that his Lisinopril medication is making him cough. He stated that he brought it up on his 09/17/14 visit, patient wants to know if there is another medication similar to the Lisinopril that he could take, he stated that he stopped taking the Lisinopril for a couple of days and the coughing did stop. Please advise patient on what to do. Clarnce Homan,CMA

## 2015-02-28 ENCOUNTER — Other Ambulatory Visit: Payer: Self-pay | Admitting: Sports Medicine

## 2015-02-28 DIAGNOSIS — I10 Essential (primary) hypertension: Secondary | ICD-10-CM

## 2015-02-28 MED ORDER — VALSARTAN 160 MG PO TABS: 160.0000 mg | ORAL_TABLET | Freq: Every day | ORAL | Status: DC

## 2015-05-13 ENCOUNTER — Other Ambulatory Visit: Payer: Self-pay | Admitting: Sports Medicine

## 2015-05-15 ENCOUNTER — Other Ambulatory Visit: Payer: Self-pay | Admitting: Sports Medicine

## 2015-05-15 DIAGNOSIS — I10 Essential (primary) hypertension: Secondary | ICD-10-CM

## 2015-05-15 MED ORDER — VALSARTAN 160 MG PO TABS: 160.0000 mg | ORAL_TABLET | Freq: Every day | ORAL | Status: DC

## 2015-05-17 ENCOUNTER — Other Ambulatory Visit: Payer: Self-pay | Admitting: Sports Medicine

## 2016-12-16 ENCOUNTER — Ambulatory Visit (INDEPENDENT_AMBULATORY_CARE_PROVIDER_SITE_OTHER): Payer: Medicare Other | Admitting: Sports Medicine

## 2016-12-16 ENCOUNTER — Encounter: Payer: Self-pay | Admitting: Sports Medicine

## 2016-12-16 DIAGNOSIS — S4382XD Sprain of other specified parts of left shoulder girdle, subsequent encounter: Secondary | ICD-10-CM | POA: Diagnosis not present

## 2016-12-16 DIAGNOSIS — S46812D Strain of other muscles, fascia and tendons at shoulder and upper arm level, left arm, subsequent encounter: Secondary | ICD-10-CM

## 2016-12-16 NOTE — Assessment & Plan Note (Signed)
Multiple derangements, supraspinatus partial thickness tear, subscapularis degeneration. Acromion clavicular osteoarthritis. Pain today is predominantly impingement related so we did a subacromial injection. Also needs to get more diligent with his rehabilitation exercises. Return to see me in one month, because he did have some articular sided tears the subsequent injection would be glenohumeral. Has not yet seen Dr. Ave Filterhandler.

## 2016-12-16 NOTE — Progress Notes (Signed)
  Subjective:    CC: Left shoulder pain  HPI: This is a pleasant 66 year old male, we saw him a couple of years ago for left shoulder pain, MRI showed some partial-thickness tears, he really has not been diligent with his exercises. Now having a recurrence of pain, worst over the deltoid, moderate, persistent and worse with overhead activities.   Past medical history:  Negative.  See flowsheet/record as well for more information.  Surgical history: Negative.  See flowsheet/record as well for more information.  Family history: Negative.  See flowsheet/record as well for more information.  Social history: Negative.  See flowsheet/record as well for more information.  Allergies, and medications have been entered into the medical record, reviewed, and no changes needed.   Review of Systems: No fevers, chills, night sweats, weight loss, chest pain, or shortness of breath.   Objective:    General: Well Developed, well nourished, and in no acute distress.  Neuro: Alert and oriented x3, extra-ocular muscles intact, sensation grossly intact.  HEENT: Normocephalic, atraumatic, pupils equal round reactive to light, neck supple, no masses, no lymphadenopathy, thyroid nonpalpable.  Skin: Warm and dry, no rashes. Cardiac: Regular rate and rhythm, no murmurs rubs or gallops, no lower extremity edema.  Respiratory: Clear to auscultation bilaterally. Not using accessory muscles, speaking in full sentences. Left Shoulder: Inspection reveals no abnormalities, atrophy or asymmetry. Palpation is normal with no tenderness over AC joint or bicipital groove. ROM is full in all planes. Rotator cuff strength normal throughout. No signs of impingement with negative Neer and Hawkin's tests, empty can. Speeds and Yergason's tests normal. No labral pathology noted with negative Obrien's, negative crank, negative clunk, and good stability. Normal scapular function observed. No painful arc and no drop arm sign. No  apprehension sign  Procedure: Real-time Ultrasound Guided Injection of  Left subacromial bursa Device: GE Logiq E  Verbal informed consent obtained.  Time-out conducted.  Noted no overlying erythema, induration, or other signs of local infection.  Skin prepped in a sterile fashion.  Local anesthesia: Topical Ethyl chloride.  With sterile technique and under real time ultrasound guidance:   I guided the 25-gauge needle into the bursa and injected 1 mL kenalog 40, 1 mL lidocaine, 1 mL bupivacaine. Completed without difficulty  Pain immediately resolved suggesting accurate placement of the medication.  Advised to call if fevers/chills, erythema, induration, drainage, or persistent bleeding.  Images permanently stored and available for review in the ultrasound unit.  Impression: Technically successful ultrasound guided injection.  Impression and Recommendations:    Partial tear of subscapularis tendon Multiple derangements, supraspinatus partial thickness tear, subscapularis degeneration. Acromion clavicular osteoarthritis. Pain today is predominantly impingement related so we did a subacromial injection. Also needs to get more diligent with his rehabilitation exercises. Return to see me in one month, because he did have some articular sided tears the subsequent injection would be glenohumeral. Has not yet seen Dr. Ave Filterhandler.

## 2016-12-18 ENCOUNTER — Other Ambulatory Visit: Payer: Self-pay

## 2016-12-18 DIAGNOSIS — I10 Essential (primary) hypertension: Secondary | ICD-10-CM

## 2016-12-18 MED ORDER — VALSARTAN 160 MG PO TABS: 160.0000 mg | ORAL_TABLET | Freq: Every day | ORAL | 3 refills | Status: DC

## 2017-01-21 ENCOUNTER — Ambulatory Visit (INDEPENDENT_AMBULATORY_CARE_PROVIDER_SITE_OTHER): Payer: Medicare Other | Admitting: Sports Medicine

## 2017-01-21 ENCOUNTER — Encounter: Payer: Self-pay | Admitting: Sports Medicine

## 2017-01-21 DIAGNOSIS — S4382XD Sprain of other specified parts of left shoulder girdle, subsequent encounter: Secondary | ICD-10-CM | POA: Diagnosis not present

## 2017-01-21 DIAGNOSIS — S46812D Strain of other muscles, fascia and tendons at shoulder and upper arm level, left arm, subsequent encounter: Secondary | ICD-10-CM

## 2017-01-21 NOTE — Progress Notes (Signed)
  Subjective:    CC: Follow-up  HPI: Left shoulder pain: Only a minimal and temporary response to a subacromial injection, now having a recurrence of pain this time mostly at the joint line. Moderate, persistent without radiation.  Past medical history:  Negative.  See flowsheet/record as well for more information.  Surgical history: Negative.  See flowsheet/record as well for more information.  Family history: Negative.  See flowsheet/record as well for more information.  Social history: Negative.  See flowsheet/record as well for more information.  Allergies, and medications have been entered into the medical record, reviewed, and no changes needed.   Review of Systems: No fevers, chills, night sweats, weight loss, chest pain, or shortness of breath.   Objective:    General: Well Developed, well nourished, and in no acute distress.  Neuro: Alert and oriented x3, extra-ocular muscles intact, sensation grossly intact.  HEENT: Normocephalic, atraumatic, pupils equal round reactive to light, neck supple, no masses, no lymphadenopathy, thyroid nonpalpable.  Skin: Warm and dry, no rashes. Cardiac: Regular rate and rhythm, no murmurs rubs or gallops, no lower extremity edema.  Respiratory: Clear to auscultation bilaterally. Not using accessory muscles, speaking in full sentences.  Procedure: Real-time Ultrasound Guided Injection of left glenohumeral joint Device: GE Logiq E  Verbal informed consent obtained.  Time-out conducted.  Noted no overlying erythema, induration, or other signs of local infection.  Skin prepped in a sterile fashion.  Local anesthesia: Topical Ethyl chloride.  With sterile technique and under real time ultrasound guidance:  I did have some difficulty getting the spinal needle into the joint was able to inject 1 mL Kenalog 40, 2 mL lidocaine, 2 mL bupivacaine easily Completed without difficulty  Pain immediately resolved suggesting accurate placement of the  medication.  Advised to call if fevers/chills, erythema, induration, drainage, or persistent bleeding.  Images permanently stored and available for review in the ultrasound unit.  Impression: Technically successful ultrasound guided injection.  Impression and Recommendations:    Partial tear of subscapularis tendon Moderate response to subacromial injection at the last visit, now having some glenohumeral type pain. Glenohumeral injection as above, return in one month.

## 2017-01-21 NOTE — Assessment & Plan Note (Addendum)
Moderate response to subacromial injection at the last visit, now having some glenohumeral type pain. Glenohumeral injection as above, return in one month.

## 2017-03-18 ENCOUNTER — Telehealth: Payer: Self-pay | Admitting: Sports Medicine

## 2017-03-18 NOTE — Telephone Encounter (Signed)
Not all valsartan was recalled, he needs to call his pharmacy and see if he had the batch or lot number that was involved in the recall. And no, nosebleeds are unlikely related.

## 2017-03-18 NOTE — Telephone Encounter (Signed)
Patient called adv that he takes Valsartan and I adv him of a recall for that med and he states he has been getting nose bleeds at least once a week and he wanted to know is that normal or a side effect. He has an appt this Friday to see you for his left shoulder pain-vew

## 2017-03-21 ENCOUNTER — Ambulatory Visit (INDEPENDENT_AMBULATORY_CARE_PROVIDER_SITE_OTHER): Payer: Medicare Other | Admitting: Sports Medicine

## 2017-03-21 ENCOUNTER — Encounter: Payer: Self-pay | Admitting: Sports Medicine

## 2017-03-21 DIAGNOSIS — S4382XD Sprain of other specified parts of left shoulder girdle, subsequent encounter: Secondary | ICD-10-CM | POA: Diagnosis not present

## 2017-03-21 DIAGNOSIS — S46812D Strain of other muscles, fascia and tendons at shoulder and upper arm level, left arm, subsequent encounter: Secondary | ICD-10-CM

## 2017-03-21 DIAGNOSIS — I10 Essential (primary) hypertension: Secondary | ICD-10-CM

## 2017-03-21 MED ORDER — VALSARTAN 320 MG PO TABS: 320.0000 mg | ORAL_TABLET | Freq: Every day | ORAL | 3 refills | Status: DC

## 2017-03-21 NOTE — Progress Notes (Signed)
  Subjective:    CC: Follow-up  HPI: Left shoulder pain: Multiple internal derangements on the recent MRI, we tried several injections, formal physical therapy without improvement. He initially responded well for years but the most recent injections of only provided a couple of months of relief.  Elevated blood pressure: Currently on valsartan 160, lately the blood pressure has been running somewhat high.  Past medical history:  Negative.  See flowsheet/record as well for more information.  Surgical history: Negative.  See flowsheet/record as well for more information.  Family history: Negative.  See flowsheet/record as well for more information.  Social history: Negative.  See flowsheet/record as well for more information.  Allergies, and medications have been entered into the medical record, reviewed, and no changes needed.   Review of Systems: No fevers, chills, night sweats, weight loss, chest pain, or shortness of breath.   Objective:    General: Well Developed, well nourished, and in no acute distress.  Neuro: Alert and oriented x3, extra-ocular muscles intact, sensation grossly intact.  HEENT: Normocephalic, atraumatic, pupils equal round reactive to light, neck supple, no masses, no lymphadenopathy, thyroid nonpalpable.  Skin: Warm and dry, no rashes. Cardiac: Regular rate and rhythm, no murmurs rubs or gallops, no lower extremity edema.  Respiratory: Clear to auscultation bilaterally. Not using accessory muscles, speaking in full sentences.  Impression and Recommendations:    Partial tear of subscapularis tendon Persistent left shoulder pain, multifactorial and related to acromioclavicular osteoarthritis, rotator cuff tearing, as well as glenohumeral degenerative changes. He's had formal physical therapy, multiple subacromial and glenohumeral injections, all initially worked well for years but now is having recurrence as of pain only 2 months after the injections. He is going  to at this point be referred to Dr. Ave Filterhandler for arthroscopic intervention.  Benign essential hypertension Blood pressure is persistently elevated on multiple checks at home for the patient as well as here in the office today. He has been having some nosebleeds. Increasing valsartan 320 mg, patient will return to see me in 2 weeks to recheck blood pressure. If persistently elevated we will probably recheck his renal function at that time.  I spent 25 minutes with this patient, greater than 50% was face-to-face time counseling regarding the above diagnoses

## 2017-03-21 NOTE — Assessment & Plan Note (Signed)
Persistent left shoulder pain, multifactorial and related to acromioclavicular osteoarthritis, rotator cuff tearing, as well as glenohumeral degenerative changes. He's had formal physical therapy, multiple subacromial and glenohumeral injections, all initially worked well for years but now is having recurrence as of pain only 2 months after the injections. He is going to at this point be referred to Dr. Ave Filterhandler for arthroscopic intervention.

## 2017-03-21 NOTE — Assessment & Plan Note (Signed)
Blood pressure is persistently elevated on multiple checks at home for the patient as well as here in the office today. He has been having some nosebleeds. Increasing valsartan 320 mg, patient will return to see me in 2 weeks to recheck blood pressure. If persistently elevated we will probably recheck his renal function at that time.

## 2017-03-28 ENCOUNTER — Telehealth: Payer: Self-pay | Admitting: Sports Medicine

## 2017-03-28 MED ORDER — IRBESARTAN 300 MG PO TABS: 300.0000 mg | ORAL_TABLET | Freq: Every day | ORAL | 3 refills | Status: DC

## 2017-03-28 NOTE — Telephone Encounter (Signed)
Switching to Avapro. He needs to check his blood pressures over the next several days when starting the new medication.

## 2017-03-28 NOTE — Telephone Encounter (Signed)
Pt went to get his Valsarten prescription today and the pharmcay states it has been recalled and the Dr needs to call something else in for this patient.

## 2017-03-28 NOTE — Telephone Encounter (Signed)
Called patient and advised

## 2017-04-03 ENCOUNTER — Ambulatory Visit: Payer: Medicare Other | Admitting: Sports Medicine

## 2017-04-07 ENCOUNTER — Other Ambulatory Visit: Payer: Self-pay | Admitting: Family Medicine

## 2017-04-07 ENCOUNTER — Telehealth: Payer: Self-pay | Admitting: Sports Medicine

## 2017-04-07 DIAGNOSIS — N5089 Other specified disorders of the male genital organs: Secondary | ICD-10-CM

## 2017-04-07 NOTE — Telephone Encounter (Addendum)
Pt called. He states, he is confused about his dosage.  He has been changed from Valsartan 160 mg  to Irbesartan(due to recall on Valsartan) 300 mg. However, his new rx for Irbesartan  does not reflect this new change, "on the bottle of new rx it shows 300 mg" but on the "side of the tab" i it shows 160mg .  Thank you.

## 2017-04-07 NOTE — Telephone Encounter (Signed)
Has he though about talking to the pharmacy?  He should be on Irbesartan 300mg .

## 2017-04-08 NOTE — Telephone Encounter (Signed)
Patient advised that the medication for Avapro is not the same as Valsartan so the strengths are different.

## 2017-04-10 ENCOUNTER — Ambulatory Visit
Admission: RE | Admit: 2017-04-10 | Discharge: 2017-04-10 | Disposition: A | Discharge status: Home / Self Care | Payer: Medicare Other | Source: Ambulatory Visit | Attending: Family Medicine | Admitting: Family Medicine

## 2017-04-10 DIAGNOSIS — N5089 Other specified disorders of the male genital organs: Secondary | ICD-10-CM

## 2017-07-27 ENCOUNTER — Other Ambulatory Visit: Payer: Self-pay | Admitting: Sports Medicine

## 2017-12-14 ENCOUNTER — Other Ambulatory Visit: Payer: Self-pay | Admitting: Sports Medicine

## 2018-08-10 ENCOUNTER — Encounter: Payer: Self-pay | Admitting: Cardiology

## 2018-09-06 NOTE — Progress Notes (Signed)
Cardiology Office Note   Date:  09/08/2018   ID:  Robert West, DOB May 20, 1951, MRN [id number]  PCP:  Robert West.Robert Saucer, MD  Cardiologist:   No primary care provider on file. Referring:  Robert West, L.Robert Saucer, MD  Chief Complaint  Patient presents with  . Atrial Fibrillation      History of Present Illness: Robert West is a 68 y.o. male who is referred by Robert West, L.Robert Saucer, MD for evaluation of atrial fib new onset.  Patient has no past cardiac history.  He had flulike symptoms in late December.  He was having a lot of coughing.  He felt poorly and went to his doctor and was noted to have an irregular heartbeat.  He was in atrial fibrillation.  He was switched from amlodipine to beta-blocker for blood pressure and rate control.  He did have labs which demonstrated that his TSH was slightly low.  He did not notice the palpitations.  He had no presyncope or syncope.  He had no chest pressure, neck or arm discomfort.  He has no shortness of breath, PND or orthopnea.  He had no weight gain or edema.  Is had no fevers or chills.  He otherwise has no cardiac history and is quite healthy.   Past Medical History:  Diagnosis Date  . Benign essential hypertension 07/04/2014  . GERD (gastroesophageal reflux disease)   . Partial tear of subscapularis tendon 08/18/2014  . Subclinical hyperthyroidism 07/06/2014    Past Surgical History:  Procedure Laterality Date  . EYE SURGERY     Right eye/detached retina  . TONSILLECTOMY       Current Outpatient Medications  Medication Sig Dispense Refill  . metoprolol succinate (TOPROL-XL) 50 MG 24 hr tablet Take 50 mg by mouth daily.    . pantoprazole (PROTONIX) 40 MG tablet Take 40 mg by mouth daily as needed.     No current facility-administered medications for this visit.     Allergies:   Ace inhibitors and Zofran [ondansetron hcl]    Social History:  The patient  reports that he has never smoked. He has never used smokeless tobacco. He reports  that he does not drink alcohol or use drugs.   Family History:  The patient's family history includes Heart attack in his father.    ROS:  Please see the history of present illness.   Otherwise, review of systems are positive for none.   All other systems are reviewed and negative.    PHYSICAL EXAM: VS:  BP 124/70 (BP Location: Right Arm, Patient Position: Sitting, Cuff Size: Normal)   Ht 5\' 7"  (1.702 m)   Wt 161 lb 6.4 oz (73.2 kg)   BMI 25.28 kg/m  , BMI Body mass index is 25.28 kg/m. GENERAL:  Well appearing HEENT:  Pupils equal round and reactive, fundi not visualized, oral mucosa unremarkable NECK:  No jugular venous distention, waveform within normal limits, carotid upstroke brisk and symmetric, no bruits, no thyromegaly LYMPHATICS:  No cervical, inguinal adenopathy LUNGS:  Clear to auscultation bilaterally BACK:  No CVA tenderness CHEST:  Unremarkable HEART:  PMI not displaced or sustained,S1 and S2 within normal limits, no S3, no S4, no clicks, no rubs, no murmurs ABD:  Flat, positive bowel sounds normal in frequency in pitch, no bruits, no rebound, no guarding, no midline pulsatile mass, no hepatomegaly, no splenomegaly EXT:  2 plus pulses throughout, no edema, no cyanosis no clubbing SKIN:  No rashes no nodules NEURO:  Cranial nerves II through XII  grossly intact, motor grossly intact throughout PSYCH:  Cognitively intact, oriented to person place and time    EKG:  EKG is ordered today. The ekg ordered today demonstrates sinus rhythm, rate 61, axis within normal limits, intervals within normal limits, no acute ST-T wave changes.   Recent Labs: No results found for requested labs within last 8760 hours.    Lipid Panel    Component Value Date/Time   CHOL 162 07/04/2014 0909   TRIG 113 07/04/2014 0909   HDL 42 07/04/2014 0909   CHOLHDL 3.9 07/04/2014 0909   VLDL 23 07/04/2014 0909   LDLCALC 97 07/04/2014 0909      Wt Readings from Last 3 Encounters:    09/08/18 161 lb 6.4 oz (73.2 kg)  03/21/17 157 lb (71.2 kg)  01/21/17 157 lb (71.2 kg)      Other studies Reviewed: Additional studies/ records that were reviewed today include: Office records, EKG. Review of the above records demonstrates:  Please see elsewhere in the note.     ASSESSMENT AND PLAN:  ATRIAL FIB: The patient has had one episode as far as I can tell of atrial fibrillation when he was acutely ill.  He is back in sinus rhythm.  Mr. Robert West has a CHA2DS2 - VASc score of 2 would require anticoagulation if he was to have recurrent atrial fibrillation.  However, this is possibly an isolated event.  He had no symptoms with this.  I would continue the metoprolol.  I do not think further imaging is indicated.  However, they will keep up with surveillance for recurrent atrial fibrillation by getting an Alive Cor Device.  I do think they need to have follow-up of the reduced TSH as below.  ABNORMAL TSH: He had a mildly abnormal TSH which was reduced I have asked him to follow-up with his primary provider.  HTN:  BP controlled.  Continue the beta blocker.    Current medicines are reviewed at length with the patient today.  The patient has no concerns regarding medicines.  The following changes have been made:  no change  Labs/ tests ordered today include: None  Orders Placed This Encounter  Procedures  . EKG 12-Lead     Disposition:   FU with me as needed     Signed, Robert Rotunda, MD  09/08/2018 9:06 AM    [facility]

## 2018-09-08 ENCOUNTER — Ambulatory Visit: Payer: Medicare Other | Admitting: Cardiology

## 2018-09-08 ENCOUNTER — Encounter: Payer: Self-pay | Admitting: Cardiology

## 2018-09-08 VITALS — BP 124/70 | Ht 67.0 in | Wt 161.4 lb

## 2018-09-08 DIAGNOSIS — R7989 Other specified abnormal findings of blood chemistry: Secondary | ICD-10-CM

## 2018-09-08 DIAGNOSIS — I10 Essential (primary) hypertension: Secondary | ICD-10-CM | POA: Diagnosis not present

## 2018-09-08 DIAGNOSIS — I480 Paroxysmal atrial fibrillation: Secondary | ICD-10-CM

## 2018-09-08 NOTE — Patient Instructions (Signed)
Medication Instructions:  Continue current medications  If you need a refill on your cardiac medications before your next appointment, please call your pharmacy.  Labwork: None Ordered  Take the provided lab slips with you to the lab for your blood draw.   When you have your labs (blood work) drawn today and your tests are completely normal, you will receive your results only by MyChart Message (if you have MyChart) -OR-  A paper copy in the mail.  If you have any lab test that is abnormal or we need to change your treatment, we will call you to review these results.  Testing/Procedures: None Ordered  Special Instructions: AliveCor  Follow-Up: . You will need a follow up appointment in As Needed   At [facility], you and your health needs are our priority.  As part of our continuing mission to provide you with exceptional heart care, we have created designated Provider Care Teams.  These Care Teams include your primary Cardiologist (physician) and Advanced Practice Providers (APPs -  Physician Assistants and Nurse Practitioners) who all work together to provide you with the care you need, when you need it.   Thank you for choosing [facility] at [facility]!!

## 2019-06-03 ENCOUNTER — Telehealth: Payer: Self-pay | Admitting: Cardiology

## 2019-06-03 NOTE — Telephone Encounter (Signed)
I would suggest that he continue the beta blocker.  Call Mr. Giese with the results and send results to [place], L.Marlou Sa, MD

## 2019-06-03 NOTE — Telephone Encounter (Signed)
Please advise of previous message- I contacted patient this morning to advise of his BP to see how he was doing- BP listed below 145/80-

## 2019-06-03 NOTE — Telephone Encounter (Signed)
Patient would like to know how long does he need to continue taking metoprolol succinate (TOPROL-XL) 50 MG 24 hr tablet. He wants to know if he can stop taking it.

## 2019-06-03 NOTE — Telephone Encounter (Signed)
Attempted to contact patient- no answer, LVM to call back to discuss.

## 2019-06-03 NOTE — Telephone Encounter (Signed)
Follow up   Patient states that he is returning your call his b/p is 145/80. Please call.

## 2019-06-04 NOTE — Telephone Encounter (Signed)
Gave pt Dr. Rosezella [place] advice. Verbalized understaning.

## 2019-06-22 ENCOUNTER — Ambulatory Visit (INDEPENDENT_AMBULATORY_CARE_PROVIDER_SITE_OTHER): Payer: Medicare Other

## 2019-06-22 ENCOUNTER — Encounter: Payer: Self-pay | Admitting: Sports Medicine

## 2019-06-22 ENCOUNTER — Other Ambulatory Visit: Payer: Self-pay

## 2019-06-22 ENCOUNTER — Ambulatory Visit (INDEPENDENT_AMBULATORY_CARE_PROVIDER_SITE_OTHER): Payer: Medicare Other | Admitting: Sports Medicine

## 2019-06-22 DIAGNOSIS — M25511 Pain in right shoulder: Secondary | ICD-10-CM

## 2019-06-22 DIAGNOSIS — S4991XA Unspecified injury of right shoulder and upper arm, initial encounter: Secondary | ICD-10-CM

## 2019-06-22 DIAGNOSIS — M75101 Unspecified rotator cuff tear or rupture of right shoulder, not specified as traumatic: Secondary | ICD-10-CM | POA: Insufficient documentation

## 2019-06-22 MED ORDER — TRAMADOL HCL 50 MG PO TABS: 50.0000 mg | ORAL_TABLET | Freq: Three times a day (TID) | ORAL | 0 refills | Status: DC | PRN

## 2019-06-22 NOTE — Assessment & Plan Note (Signed)
Fall off of a ladder 10 days ago. Profound weakness to abduction and internal rotation, positive speeds test as well with weakness. I do suspect rotator cuff tearing and biceps tearing. X-rays, sling, tramadol for pain. MRI without contrast, return to see me for MRI results.

## 2019-06-22 NOTE — Progress Notes (Signed)
Subjective:    CC: R shoulder injury from fall  HPI: Robert West is a pleasant 68 year old who presents today with 10 days of R shoulder pain after falling from a ladder. His pain has not improved since the injury and has interfered with his daily tasks and sleep. He denies radiation down the arm. He states that he has sever pain and weakness when attempting to lift his arm overhead or behind his back.   I reviewed the past medical history, family history, social history, surgical history, and allergies today and no changes were needed.  Please see the problem list section below in epic for further details.  Past Medical History: Past Medical History:  Diagnosis Date  . Benign essential hypertension 07/04/2014  . GERD (gastroesophageal reflux disease)   . Partial tear of subscapularis tendon 08/18/2014  . Subclinical hyperthyroidism 07/06/2014   Past Surgical History: Past Surgical History:  Procedure Laterality Date  . EYE SURGERY     Right eye/detached retina  . TONSILLECTOMY     Social History: Social History   Socioeconomic History  . Marital status: Married    Spouse name: Not on file  . Number of children: Not on file  . Years of education: Not on file  . Highest education level: Not on file  Occupational History  . Not on file  Social Needs  . Financial resource strain: Not on file  . Food insecurity    Worry: Not on file    Inability: Not on file  . Transportation needs    Medical: Not on file    Non-medical: Not on file  Tobacco Use  . Smoking status: Never Smoker  . Smokeless tobacco: Never Used  Substance and Sexual Activity  . Alcohol use: No  . Drug use: No  . Sexual activity: Yes  Lifestyle  . Physical activity    Days per week: Not on file    Minutes per session: Not on file  . Stress: Not on file  Relationships  . Social Musician on phone: Not on file    Gets together: Not on file    Attends religious service: Not on file    Active  member of club or organization: Not on file    Attends meetings of clubs or organizations: Not on file    Relationship status: Not on file  Other Topics Concern  . Not on file  Social History Narrative   Married.  Retired Hydrologist.  Two children and five grands.    Family History: Family History  Problem Relation Age of Onset  . Heart attack Father        No details   Allergies: Allergies  Allergen Reactions  . Ace Inhibitors Cough  . Zofran [Ondansetron Hcl] Other (See Comments)    Mental changes   Medications: See med rec.  Review of Systems: No fevers, chills, night sweats, weight loss, chest pain, or shortness of breath.   Objective:    General: Well Developed, well nourished, and in no acute distress.  Neuro: Alert and oriented x3, extra-ocular muscles intact, sensation grossly intact.  HEENT: Normocephalic, atraumatic, pupils equal round reactive to light, neck supple, no masses, no lymphadenopathy, thyroid nonpalpable.  Skin: Warm and dry, no rashes. Cardiac: Regular rate and rhythm, no murmurs rubs or gallops, no lower extremity edema.  Respiratory: Clear to auscultation bilaterally. Not using accessory muscles, speaking in full sentences.  Shoulder: Inspection reveals no abnormalities, atrophy or asymmetry. No  tenderness over AC joint.  Tender over bicipital groove. ROM is full in all planes. Rotator cuff strength limited by pain. Signs of impingement with positive Neer and Hawkin's tests, empty can sign. Speeds Positive. Normal scapular function observed. No painful passive arc.  A/P: Robert West fell from a ladder landing against a door ~10 days ago and has experienced persistent R shoulder pain since. He has not had any loss of sensation nor radiation of pain down his arm. The injury has interfered with his sleep and daily activities. Exam was positive for Speeds, lift off, and empty can tests as well as tender to palpation over the anterior shoulder joint.  Leading on the differential is a tear of the rotator cuff +/- biceps tendons. He will be sent for an x-ray today to evaluate for bony pathologies and instructed to schedule an MRI for evaluation of the tear and to guide further treatment. Toradol was prescribed for management of pain symptoms.   Impression and Recommendations:    Shoulder injury, right, initial encounter Fall off of a ladder 10 days ago. Profound weakness to abduction and internal rotation, positive speeds test as well with weakness. I do suspect rotator cuff tearing and biceps tearing. X-rays, sling, tramadol for pain. MRI without contrast, return to see me for MRI results.   ___________________________________________ Gwen Her. Dianah Field, M.D., ABFM., CAQSM. Primary Care and Sports Medicine [place] [facility]  Adjunct Professor of [employer] [facility] [employer]

## 2019-06-29 ENCOUNTER — Other Ambulatory Visit: Payer: Self-pay

## 2019-06-29 ENCOUNTER — Ambulatory Visit (INDEPENDENT_AMBULATORY_CARE_PROVIDER_SITE_OTHER): Payer: Medicare Other

## 2019-06-29 ENCOUNTER — Telehealth: Payer: Self-pay

## 2019-06-29 DIAGNOSIS — M25511 Pain in right shoulder: Secondary | ICD-10-CM

## 2019-06-29 DIAGNOSIS — M75101 Unspecified rotator cuff tear or rupture of right shoulder, not specified as traumatic: Secondary | ICD-10-CM

## 2019-06-29 DIAGNOSIS — S4991XA Unspecified injury of right shoulder and upper arm, initial encounter: Secondary | ICD-10-CM | POA: Diagnosis not present

## 2019-06-29 MED ORDER — HYDROCODONE-ACETAMINOPHEN 10-325 MG PO TABS: 1.0000 | ORAL_TABLET | Freq: Three times a day (TID) | ORAL | 0 refills | Status: DC | PRN

## 2019-06-29 NOTE — Telephone Encounter (Signed)
Left pt msg to call back  

## 2019-06-29 NOTE — Telephone Encounter (Signed)
Referral placed to Dr. Tania Ade.

## 2019-06-29 NOTE — Telephone Encounter (Signed)
Had MRI today, reports he is in a lot of pain and Tramadol not working. Pt advised to make appt with Dr T on Thursday to discuss results.  Also advised patient we would be in touch with him as soon as Dr T reviews his results from today

## 2019-06-29 NOTE — Telephone Encounter (Signed)
Unfortunately there is a full-thickness retracted tear of the supraspinatus that is going to need surgery to fix, I am going to call in some high-dose hydrocodone, does he have a shoulder surgeon in mind or would he like me to pick someone?

## 2019-06-29 NOTE — Telephone Encounter (Signed)
Patient advised of results and recommendations. States he is ok to go back to Dr Tania Ade if possible

## 2019-07-01 ENCOUNTER — Telehealth: Payer: Self-pay

## 2019-07-01 ENCOUNTER — Ambulatory Visit: Payer: Medicare Other | Admitting: Sports Medicine

## 2019-07-01 NOTE — Telephone Encounter (Signed)
Robert West wanted to know if Dr Dianah Field thought he should have a flu vaccine before or after his injection. I did advise him to call his PCP. However, he wanted to know what Dr Dianah Field recommended.

## 2019-07-01 NOTE — Telephone Encounter (Signed)
Patient advised.

## 2019-07-01 NOTE — Telephone Encounter (Signed)
Lets separate the two by 2 weeks

## 2019-07-12 ENCOUNTER — Telehealth: Payer: Self-pay | Admitting: Cardiology

## 2019-07-12 NOTE — Telephone Encounter (Signed)
   Primary Cardiologist: Minus Breeding, MD  Chart reviewed as part of pre-operative protocol coverage. Patient seen by Dr. Percival Spanish on 09/08/2018 for evaluation of new onset atrial fibrillation in setting of acute respiratory illness. He also has history of hypertension but otherwise no cardiac history. He was asymptomatic with his atrial fibrillation and denied any cardiac symptoms. He was back in normal sinus rhythm at visit with Dr. Percival Spanish after being started on beta-blocker. Isolated episode of atrial fibrillation felt to be due to acute illness. Dr. Percival Spanish did not feel like any further imaging was indicated at that time. Given isolated episode, he was not started on any anticoagulation. Although if he has recurrence, will need to start anticoagulation given CHA2DS2-VASc score of 2. He was advised to follow-up with Cardiology as needed.  I called and spoke with patient today. He has done well since seeing Dr. Percival Spanish in 08/2018. No known recurrences and no cardiac symptoms. He remains very active and is able to complete >4.0 METS. Given past medical history and time since last visit, based on ACC/AHA guidelines, Robert West would be at acceptable risk for the planned procedure without further cardiovascular testing.   Patient may be more likely to go back into atrial fibrillation with stress from surgery, so recommend monitoring on telemetry perioperatively.   I will route this recommendation to the requesting party via Epic fax function and remove from pre-op pool.  Please call with questions.  Robert Mclean, PA-C 07/12/2019, 10:56 AM

## 2019-07-12 NOTE — Telephone Encounter (Signed)
° °  [facility] Pre-operative Risk Assessment    Request for surgical clearance:  1. What type of surgery is being performed?  2. Shoulder Orthoscopy & Rotator Cuff Repair    3. When is this surgery scheduled? 4.  07/19/19  5. What type of clearance is required (medical clearance vs. Pharmacy clearance to hold med vs. Both)?  6. Cardiac   7. Are there any medications that need to be held prior to surgery and how long? 8.  No  9. Practice name and name of physician performing surgery?  [facility] with Dr. Tania Ade  11. What is your office phone number  [phone]   7.   What is your office fax number  [phone]  9.   Anesthesia type (None, local, MAC, general) ?  General    Trilby Drummer 07/12/2019, 10:00 AM  _________________________________________________________________   (provider comments below)

## 2020-07-04 ENCOUNTER — Ambulatory Visit (INDEPENDENT_AMBULATORY_CARE_PROVIDER_SITE_OTHER): Payer: Medicare Other | Admitting: Sports Medicine

## 2020-07-04 ENCOUNTER — Ambulatory Visit (INDEPENDENT_AMBULATORY_CARE_PROVIDER_SITE_OTHER): Payer: Medicare Other

## 2020-07-04 ENCOUNTER — Other Ambulatory Visit: Payer: Self-pay

## 2020-07-04 DIAGNOSIS — M170 Bilateral primary osteoarthritis of knee: Secondary | ICD-10-CM

## 2020-07-04 MED ORDER — DICLOFENAC SODIUM 2 % EX SOLN: 2.0000 | Freq: Two times a day (BID) | CUTANEOUS | 0 refills | Status: DC

## 2020-07-04 MED ORDER — ACETAMINOPHEN ER 650 MG PO TBCR: 650.0000 mg | EXTENDED_RELEASE_TABLET | Freq: Three times a day (TID) | ORAL | 3 refills | Status: DC | PRN

## 2020-07-04 NOTE — Progress Notes (Addendum)
    Procedures performed today:    None.  Independent interpretation of notes and tests performed by another provider:   None.  Brief History, Exam, Impression, and Recommendations:    Primary osteoarthritis of both knees Known osteoarthritis, gelling, medial joint line pain without mechanical symptoms, we will start conservatively, arthritis strength Tylenol, topical Pennsaid (diclofenac 2% topical), x-rays, home rehab exercises, return to see me in a month, injection if no better, I did inject his left knee 6 years ago and this seemed to work well.  Update: Insufficient relief with Pennsaid (diclofenac 2% topical), I am going to go ahead and add Celebrex to be taken 1-2 times daily, continue arthritis strength Tylenol 3 times daily.    ___________________________________________ Ihor Austin. Benjamin Stain, M.D., ABFM., CAQSM. Primary Care and Sports Medicine [place] [facility]  Adjunct Instructor of Family Medicine  [employer]

## 2020-07-04 NOTE — Assessment & Plan Note (Addendum)
Known osteoarthritis, gelling, medial joint line pain without mechanical symptoms, we will start conservatively, arthritis strength Tylenol, topical Pennsaid (diclofenac 2% topical), x-rays, home rehab exercises, return to see me in a month, injection if no better, I did inject his left knee 6 years ago and this seemed to work well.  Update: Insufficient relief with Pennsaid (diclofenac 2% topical), I am going to go ahead and add Celebrex to be taken 1-2 times daily, continue arthritis strength Tylenol 3 times daily.

## 2020-07-05 MED ORDER — CELECOXIB 200 MG PO CAPS: ORAL_CAPSULE | ORAL | 2 refills | Status: DC

## 2020-07-05 NOTE — Addendum Note (Signed)
Addended by: Monica Becton on: 07/05/2020 11:19 AM   Modules accepted: Orders

## 2020-08-01 ENCOUNTER — Ambulatory Visit: Payer: Medicare Other | Admitting: Sports Medicine

## 2021-01-16 DIAGNOSIS — Z125 Encounter for screening for malignant neoplasm of prostate: Secondary | ICD-10-CM | POA: Diagnosis not present

## 2021-01-16 DIAGNOSIS — K219 Gastro-esophageal reflux disease without esophagitis: Secondary | ICD-10-CM | POA: Diagnosis not present

## 2021-01-16 DIAGNOSIS — Z0000 Encounter for general adult medical examination without abnormal findings: Secondary | ICD-10-CM | POA: Diagnosis not present

## 2021-01-16 DIAGNOSIS — I10 Essential (primary) hypertension: Secondary | ICD-10-CM | POA: Diagnosis not present

## 2021-01-16 DIAGNOSIS — E7800 Pure hypercholesterolemia, unspecified: Secondary | ICD-10-CM | POA: Diagnosis not present

## 2021-02-26 ENCOUNTER — Other Ambulatory Visit: Payer: Self-pay | Admitting: Sports Medicine

## 2021-02-26 DIAGNOSIS — M170 Bilateral primary osteoarthritis of knee: Secondary | ICD-10-CM

## 2021-06-07 ENCOUNTER — Encounter: Payer: Self-pay | Admitting: Podiatry

## 2021-06-07 ENCOUNTER — Ambulatory Visit: Payer: PPO | Admitting: Podiatry

## 2021-06-07 ENCOUNTER — Other Ambulatory Visit: Payer: Self-pay

## 2021-06-07 DIAGNOSIS — L600 Ingrowing nail: Secondary | ICD-10-CM

## 2021-06-07 MED ORDER — NEOMYCIN-POLYMYXIN-HC 1 % OT SOLN: OTIC | 0 refills | Status: DC

## 2021-06-07 NOTE — Patient Instructions (Signed)

## 2021-06-07 NOTE — Progress Notes (Signed)
  Subjective:  Patient ID: Robert West, male    DOB: 1951/06/05,  MRN: [id number]  Chief Complaint  Patient presents with   Ingrown Toenail    70 y.o. male presents with the above complaint. History confirmed with patient.  PCP previously remove the ingrown nail years ago and it continues to be bothersome and has worsened again  Objective:  Physical Exam: warm, good capillary refill, no trophic changes or ulcerative lesions, normal DP and PT pulses, and normal sensory exam. Left Foot:  Ingrown hallux nail medial border  Assessment:   1. Ingrowing left great toenail      Plan:  Patient was evaluated and treated and all questions answered.    Ingrown Nail, left -Patient elects to proceed with minor surgery to remove ingrown toenail today. Consent reviewed and signed by patient. -Ingrown nail excised. See procedure note. -Educated on post-procedure care including soaking. Written instructions provided and reviewed. -Patient to follow up in 2 weeks for nail check.  Procedure: Excision of Ingrown Toenail Location: Left 1st toe medial nail borders. Anesthesia: Lidocaine 1% plain; 1.5 mL and Marcaine 0.5% plain; 1.5 mL, digital block. Skin Prep: Betadine. Dressing: Silvadene; telfa; dry, sterile, compression dressing. Technique: Following skin prep, the toe was exsanguinated and a tourniquet was secured at the base of the toe. The affected nail border was freed, split with a nail splitter, and excised. Chemical matrixectomy was then performed with phenol and irrigated out with alcohol. The tourniquet was then removed and sterile dressing applied. Disposition: Patient tolerated procedure well. Patient to return in 2 weeks for follow-up.    Return in about 2 weeks (around 06/21/2021) for nail re-check.

## 2021-06-21 ENCOUNTER — Other Ambulatory Visit: Payer: Self-pay

## 2021-06-21 ENCOUNTER — Ambulatory Visit: Payer: PPO | Admitting: Podiatry

## 2021-06-21 DIAGNOSIS — L600 Ingrowing nail: Secondary | ICD-10-CM | POA: Diagnosis not present

## 2021-06-25 NOTE — Progress Notes (Signed)
  Subjective:  Patient ID: Robert West, male    DOB: October 27, 1950,  MRN: [id number]  Chief Complaint  Patient presents with   Ingrown Toenail      nail re check    70 y.o. male presents with the above complaint. History confirmed with patient.  Doing well not having issues with it  Objective:  Physical Exam: warm, good capillary refill, no trophic changes or ulcerative lesions, normal DP and PT pulses, and normal sensory exam. Left Foot: Matricectomy site healing well  Assessment:   No diagnosis found.    Plan:  Patient was evaluated and treated and all questions answered.    Ingrown Nail, left -Doing well can leave open air discontinue soaks and ointment   Return in about 2 weeks (around 07/05/2021).

## 2021-08-16 DIAGNOSIS — I10 Essential (primary) hypertension: Secondary | ICD-10-CM | POA: Diagnosis not present

## 2021-08-16 DIAGNOSIS — K219 Gastro-esophageal reflux disease without esophagitis: Secondary | ICD-10-CM | POA: Diagnosis not present

## 2022-02-10 IMAGING — DX DG KNEE COMPLETE 4+V*L*
4 series · 4 of 4 positions shown · non-contrast
Comparison: 02/12/2013

CLINICAL DATA: Primary osteoarthritis of both knees

EXAM:
LEFT KNEE - COMPLETE 4+ VIEW

[knee lat]
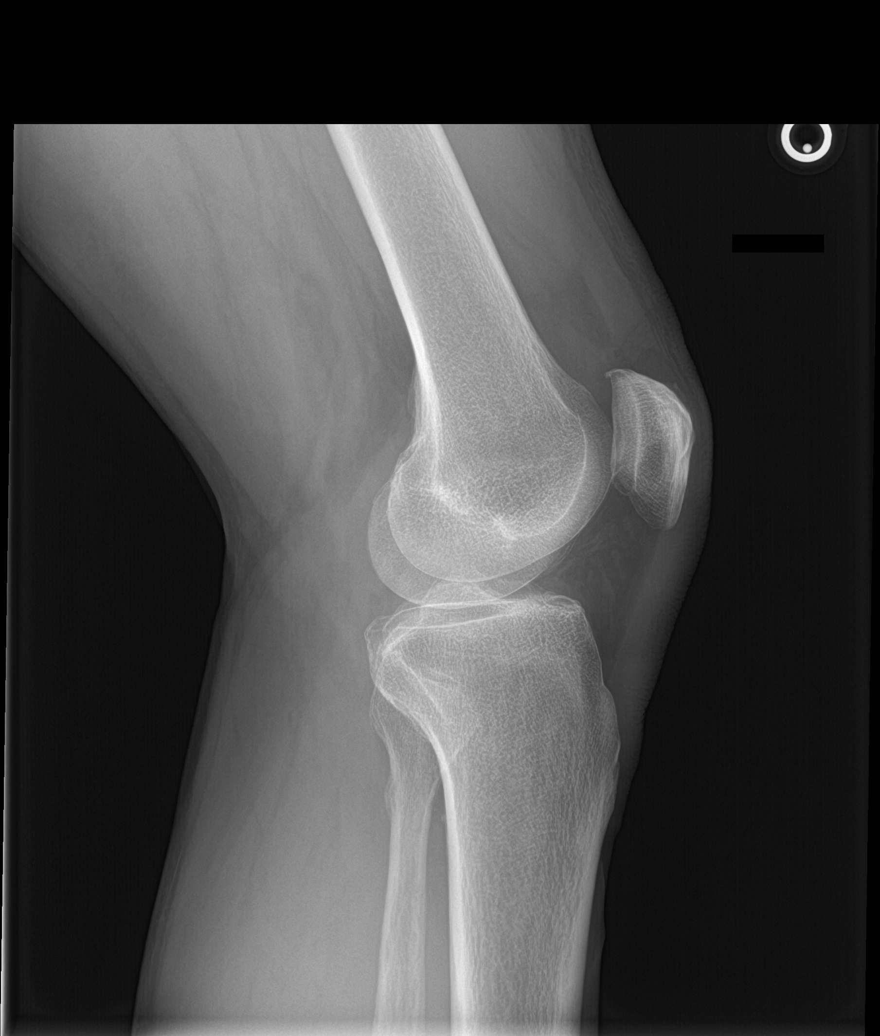

[knee sunrise]
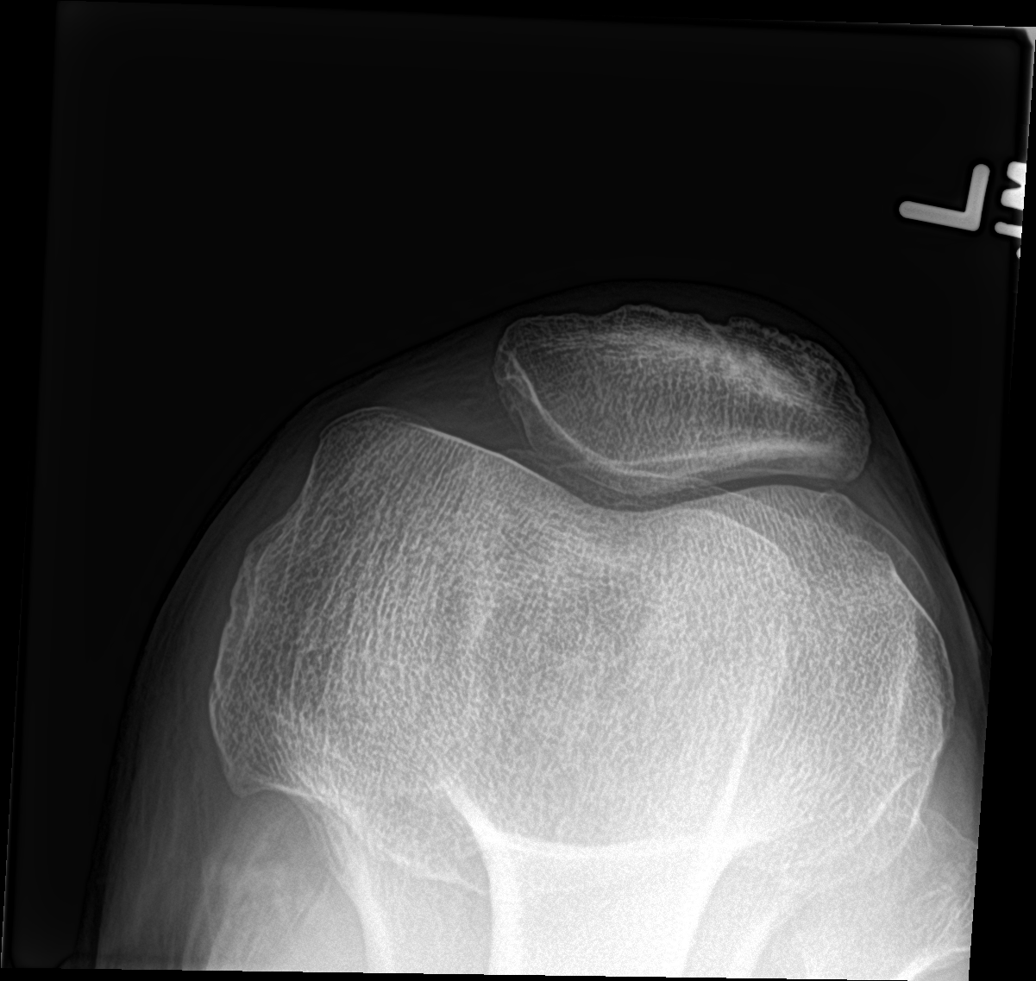

[knee ap bilat standing (1 of 2)]
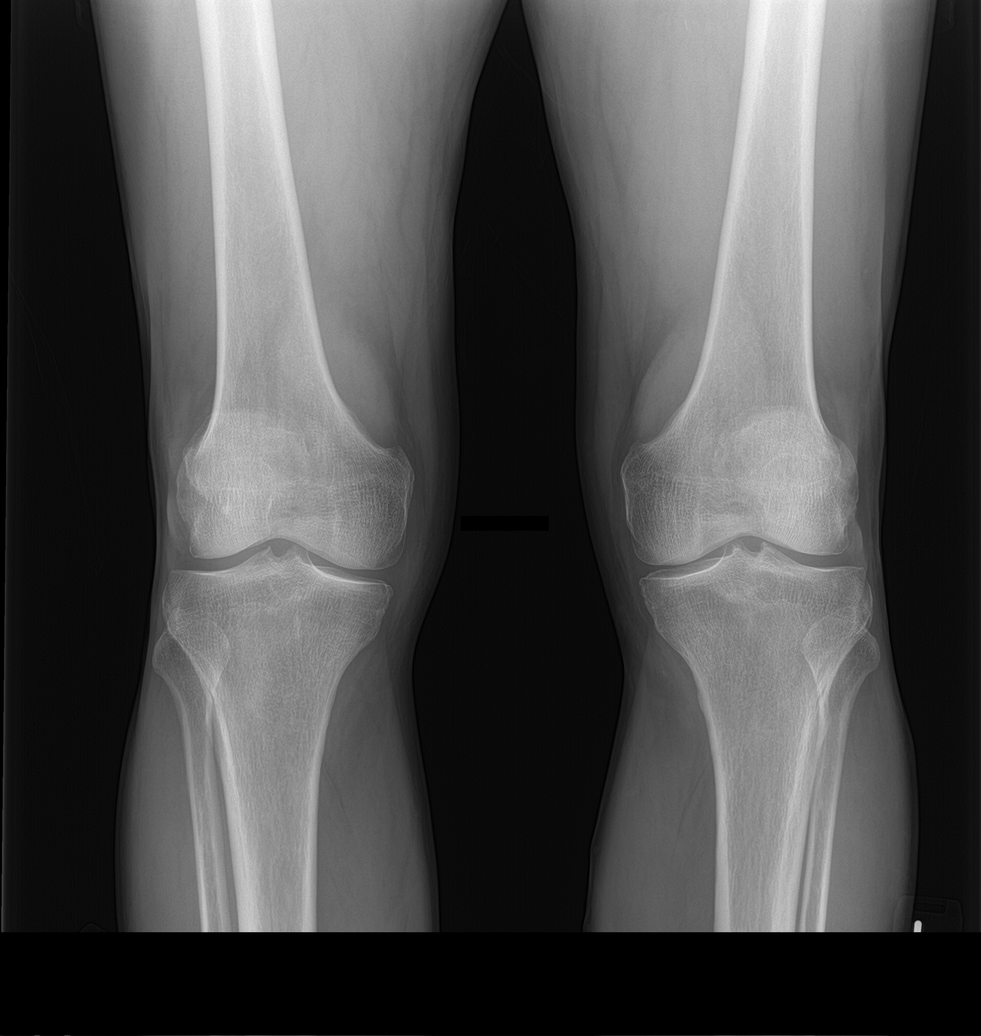

[knee ap bilat standing (2 of 2)]
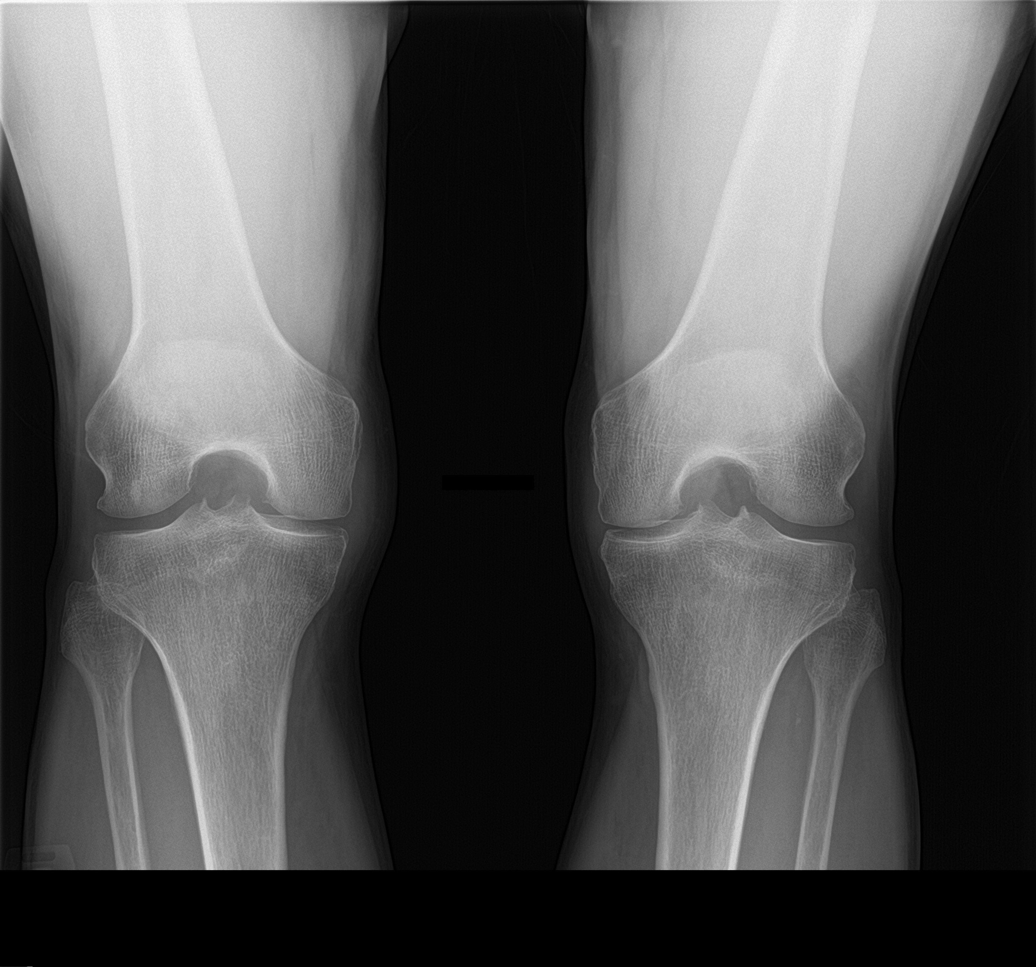

[4 of 4 positions shown; findings below may reference images not displayed]

FINDINGS: Osseous mineralization normal.

Minimal medial compartment joint space narrowing.

Tiny spur at cranial margin of patella.

No fracture, dislocation, or bone destruction.

No joint effusion.
IMPRESSION: Mild degenerative changes LEFT knee.

## 2022-02-10 IMAGING — DX DG KNEE COMPLETE 4+V*R*
4 series · 4 of 4 positions shown · non-contrast
Comparison: None

CLINICAL DATA: Primary osteoarthritis of both knees

EXAM:
RIGHT KNEE - COMPLETE 4+ VIEW

[knee lat]
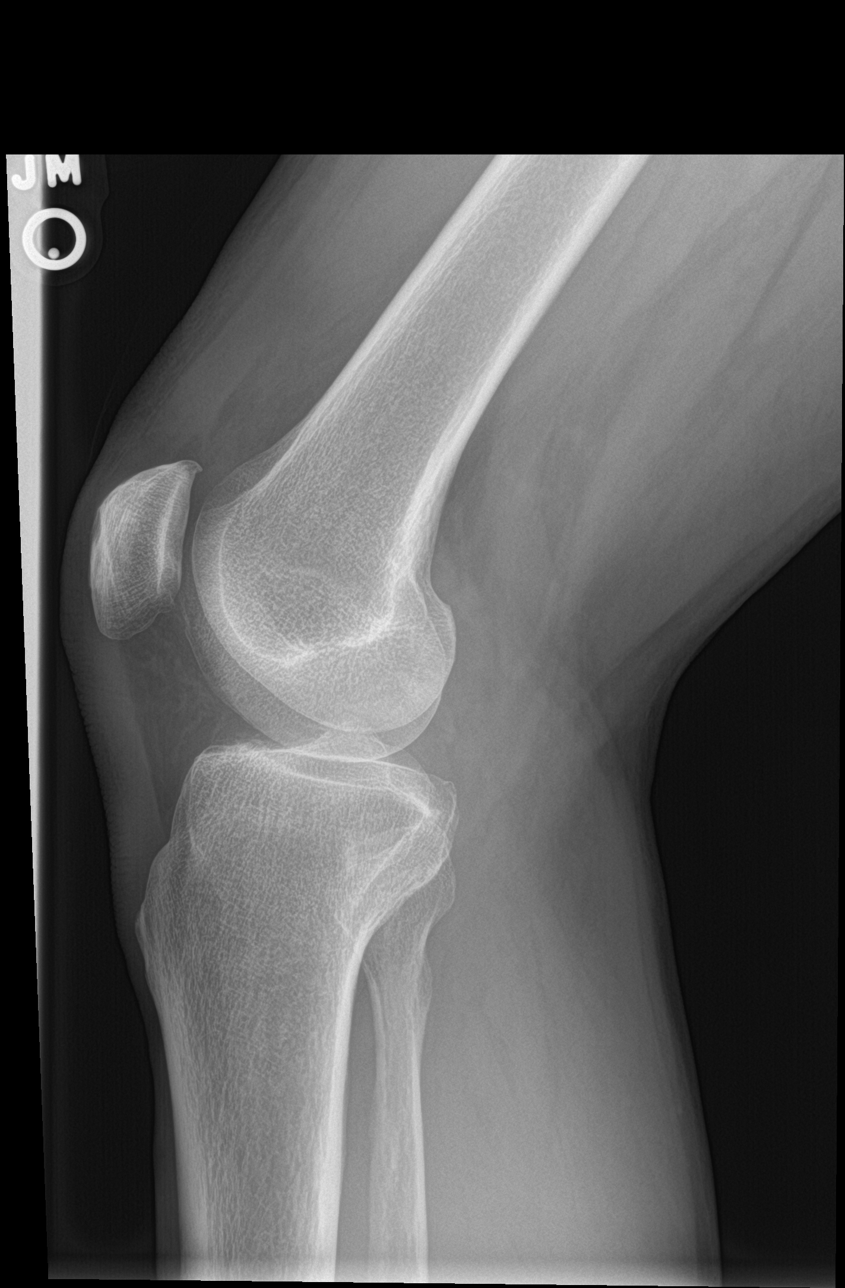

[knee sunrise]
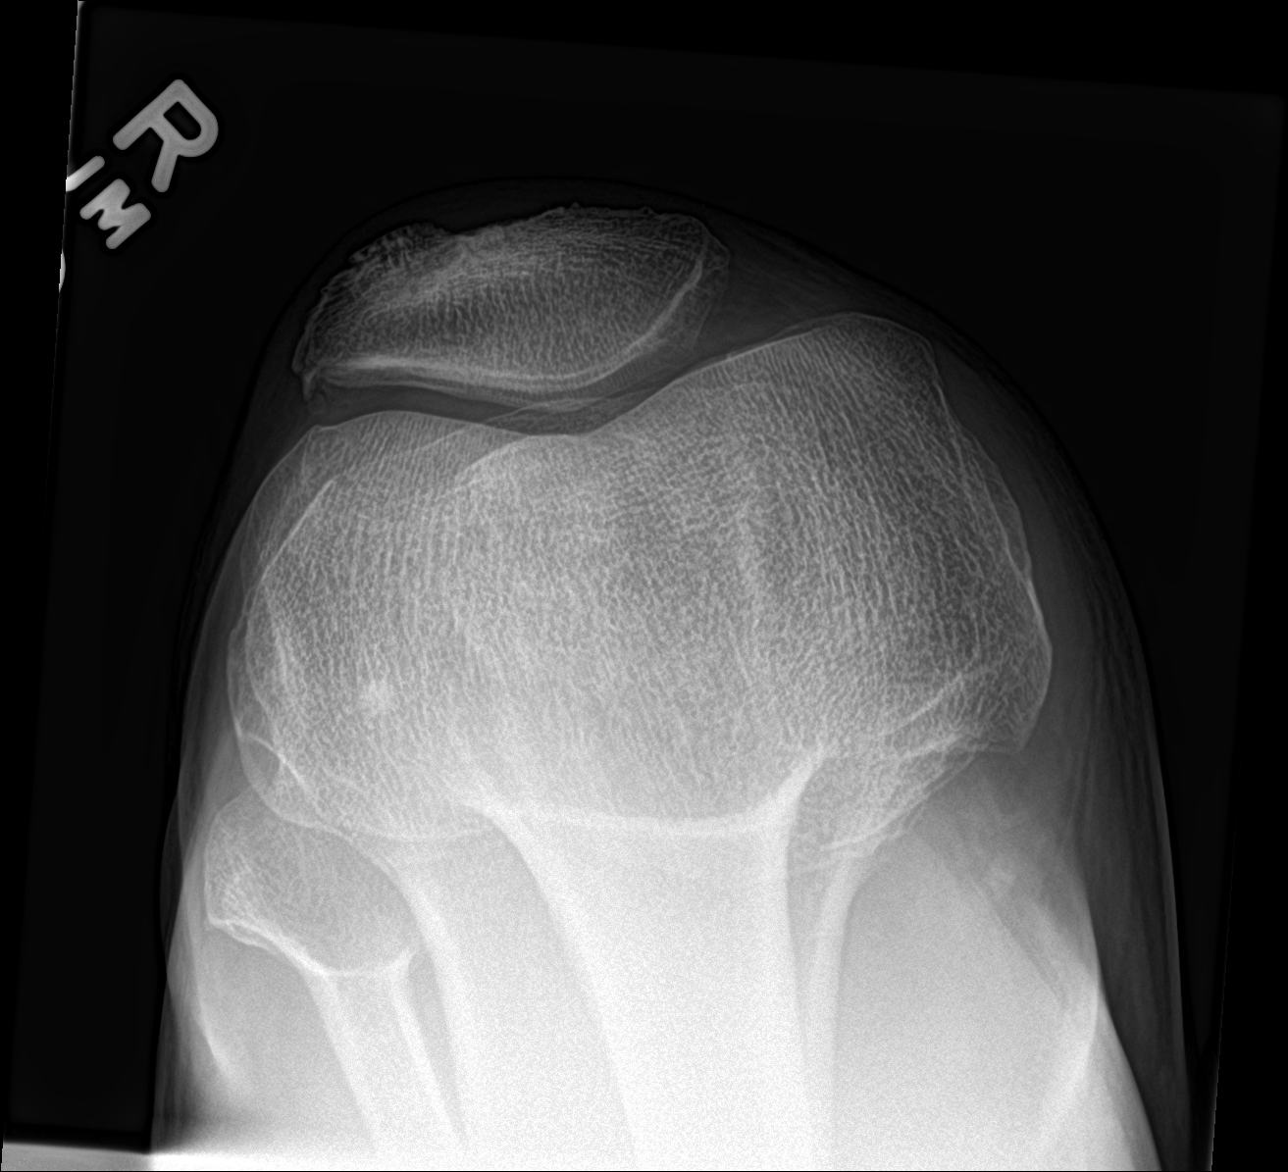

[knee ap bilat standing (1 of 2)]
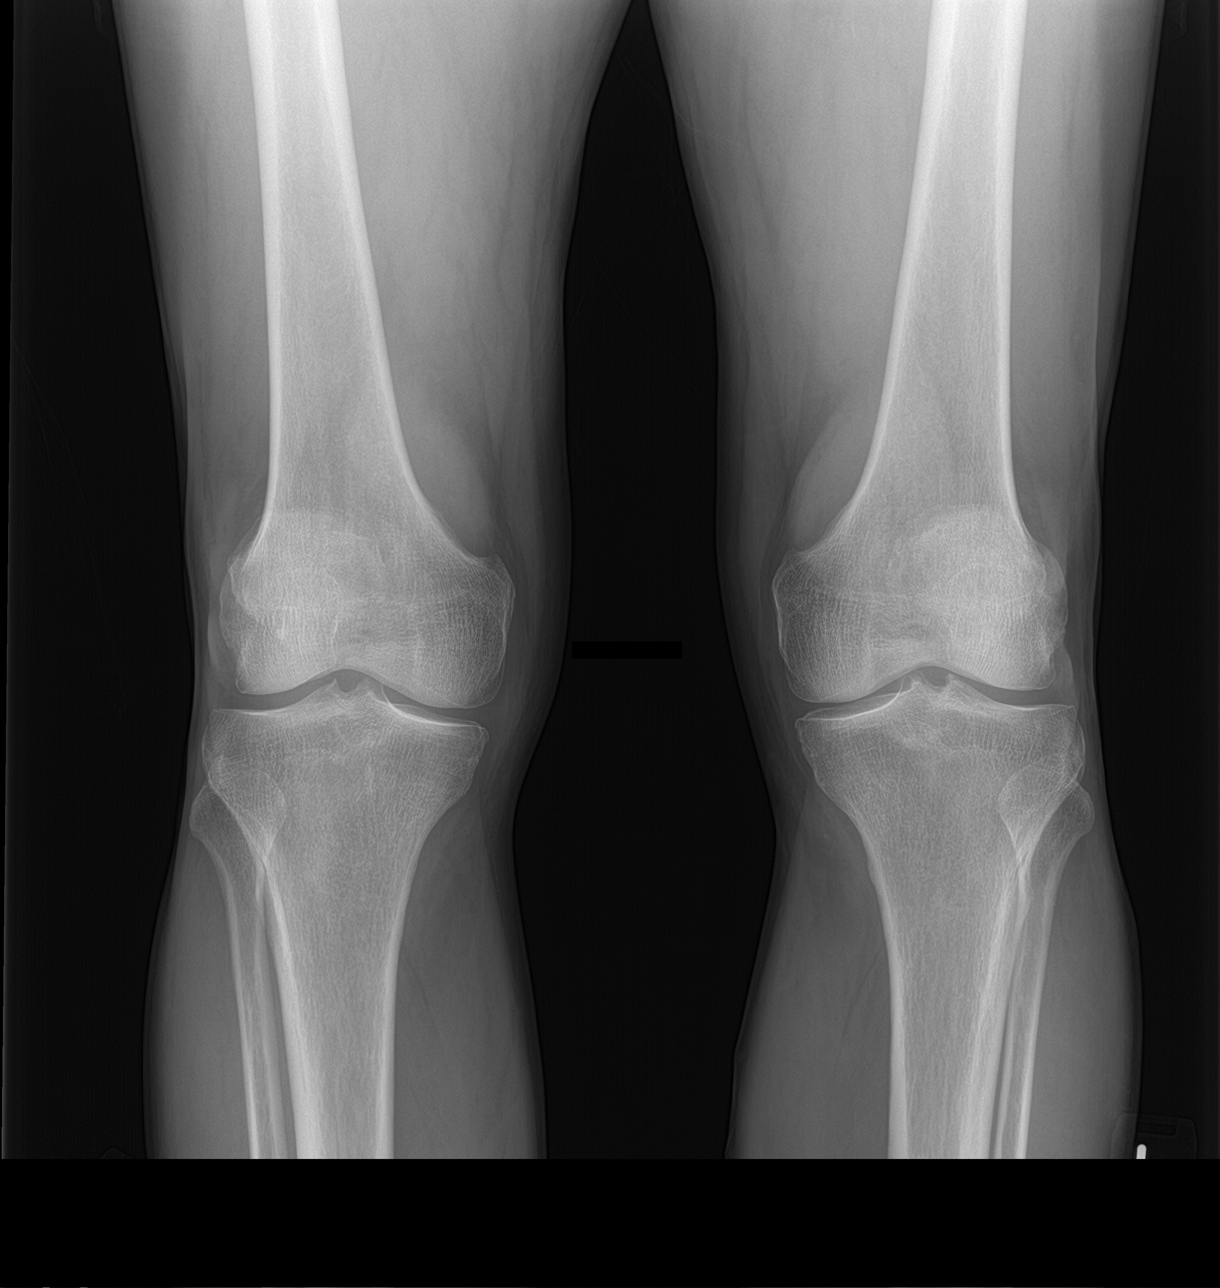

[knee ap bilat standing (2 of 2)]
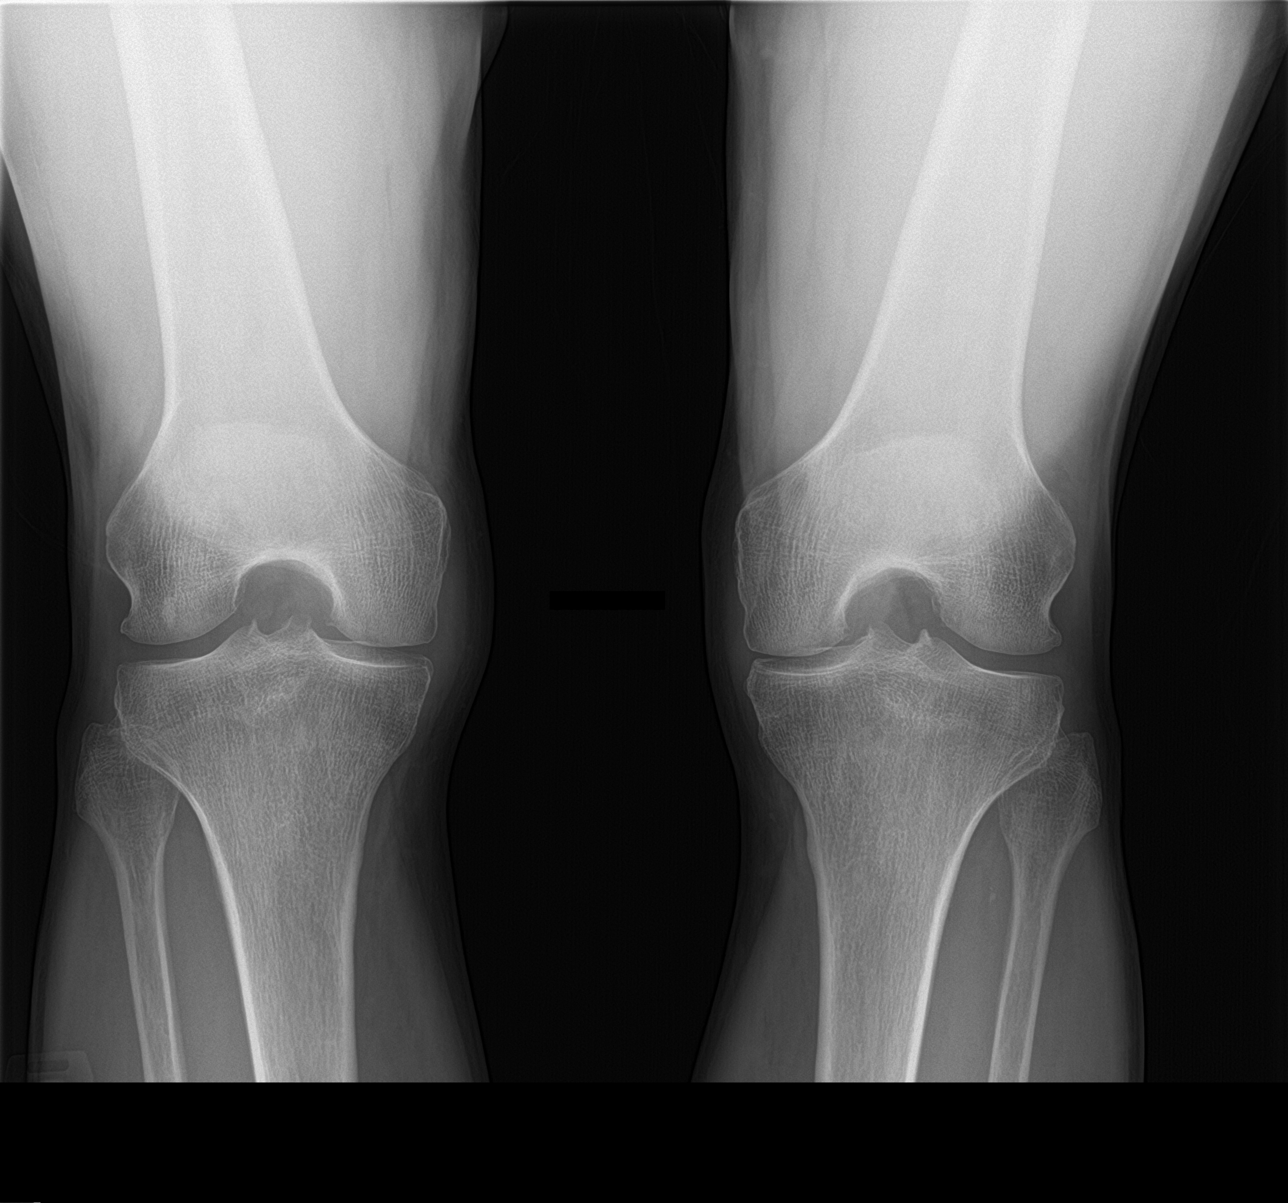

[4 of 4 positions shown; findings below may reference images not displayed]

FINDINGS: Osseous mineralization normal.

Joint spaces preserved.

Tiny spur at cranial margin of patella.

No acute fracture, dislocation, or bone destruction.

No joint effusion.
IMPRESSION: Minimal degenerative changes of RIGHT knee.

## 2022-02-15 DIAGNOSIS — H35033 Hypertensive retinopathy, bilateral: Secondary | ICD-10-CM | POA: Diagnosis not present

## 2022-02-15 DIAGNOSIS — H524 Presbyopia: Secondary | ICD-10-CM | POA: Diagnosis not present

## 2022-02-15 DIAGNOSIS — H25813 Combined forms of age-related cataract, bilateral: Secondary | ICD-10-CM | POA: Diagnosis not present

## 2022-02-15 DIAGNOSIS — H40013 Open angle with borderline findings, low risk, bilateral: Secondary | ICD-10-CM | POA: Diagnosis not present

## 2022-06-04 ENCOUNTER — Other Ambulatory Visit: Payer: Self-pay | Admitting: Sports Medicine

## 2022-06-04 DIAGNOSIS — M170 Bilateral primary osteoarthritis of knee: Secondary | ICD-10-CM

## 2022-06-26 DIAGNOSIS — Z0000 Encounter for general adult medical examination without abnormal findings: Secondary | ICD-10-CM | POA: Diagnosis not present

## 2022-06-26 DIAGNOSIS — E0590 Thyrotoxicosis, unspecified without thyrotoxic crisis or storm: Secondary | ICD-10-CM | POA: Diagnosis not present

## 2022-06-26 DIAGNOSIS — K219 Gastro-esophageal reflux disease without esophagitis: Secondary | ICD-10-CM | POA: Diagnosis not present

## 2022-06-26 DIAGNOSIS — E7800 Pure hypercholesterolemia, unspecified: Secondary | ICD-10-CM | POA: Diagnosis not present

## 2022-06-26 DIAGNOSIS — I4891 Unspecified atrial fibrillation: Secondary | ICD-10-CM | POA: Diagnosis not present

## 2022-06-26 DIAGNOSIS — I10 Essential (primary) hypertension: Secondary | ICD-10-CM | POA: Diagnosis not present

## 2022-07-19 ENCOUNTER — Ambulatory Visit: Payer: Medicare Other | Admitting: Sports Medicine

## 2022-07-25 ENCOUNTER — Ambulatory Visit (INDEPENDENT_AMBULATORY_CARE_PROVIDER_SITE_OTHER): Payer: Medicare Other | Admitting: Sports Medicine

## 2022-07-25 ENCOUNTER — Encounter: Payer: Self-pay | Admitting: Sports Medicine

## 2022-07-25 DIAGNOSIS — M170 Bilateral primary osteoarthritis of knee: Secondary | ICD-10-CM | POA: Diagnosis not present

## 2022-07-25 MED ORDER — CELECOXIB 200 MG PO CAPS: ORAL_CAPSULE | ORAL | 3 refills | Status: DC

## 2022-07-25 NOTE — Progress Notes (Signed)
    Procedures performed today:    None.  Independent interpretation of notes and tests performed by another provider:   None.  Brief History, Exam, Impression, and Recommendations:    Primary osteoarthritis of both knees Knee osteoarthritis, he has done well with an occasional injection, Celebrex seems to work well, refilling Celebrex, continue arthritis strength Tylenol 3 times daily.. Return as needed.    ____________________________________________ Ihor Austin. Benjamin Stain, M.D., ABFM., CAQSM., AME. Primary Care and Sports Medicine  [facility]  Adjunct Professor of Family Medicine  [employer]  Restaurant manager, fast food

## 2022-07-25 NOTE — Assessment & Plan Note (Signed)
Knee osteoarthritis, he has done well with an occasional injection, Celebrex seems to work well, refilling Celebrex, continue arthritis strength Tylenol 3 times daily.. Return as needed.

## 2022-07-31 NOTE — Progress Notes (Signed)
Cardiology Office Note   Date:  08/02/2022   ID:  Robert West, DOB [birth year], MRN [id number]  PCP:  Robert West.Robert Saucer, MD  Cardiologist:   Rollene Rotunda, MD Referring:  Robert West.Robert Saucer, MD  Chief Complaint  Patient presents with   Atrial Fibrillation      History of Present Illness: Robert West is a 71 y.o. male who presents for evaluation of atrial fib.  I saw him in 2020 for PAF.   He had no significant issues with taking his home medicine for URI.  He was noticed coincidently she had atrial fibrillation with initial recently.  He sent back here.  He did have blood work which included labs such as TSH.  This was borderline low.  He had previously subclinical hypothyroidism.  He is otherwise felt well.  He does not feel his heart racing or skipping.  Does not any palpitations, presyncope or syncope.  He is not having any chest pressure, neck or arm discomfort.  He had no weight gain or edema.  He is very active.     Past Medical History:  Diagnosis Date   Benign essential hypertension 07/04/2014   GERD (gastroesophageal reflux disease)    Partial tear of subscapularis tendon 08/18/2014   Subclinical hyperthyroidism 07/06/2014    Past Surgical History:  Procedure Laterality Date   EYE SURGERY     Right eye/detached retina   TONSILLECTOMY       Current Outpatient Medications  Medication Sig Dispense Refill   acetaminophen (TYLENOL) 650 MG CR tablet Take 1 tablet (650 mg total) by mouth every 8 (eight) hours as needed for pain. 90 tablet 3   amLODipine (NORVASC) 5 MG tablet Take 5 mg by mouth daily.     celecoxib (CELEBREX) 200 MG capsule TAKE 1 TO 2 TABLETS BY MOUTH DAILY AS NEEDED FOR PAIN. 180 capsule 3   Diclofenac Sodium 2 % SOLN Place 2 sprays onto the skin 2 (two) times daily. 1 g 0   hydrochlorothiazide (HYDRODIURIL) 12.5 MG tablet Take 12.5 mg by mouth daily.     metoprolol tartrate (LOPRESSOR) 25 MG tablet Take 1 tablet (25 mg total) by mouth 2 (two) times  daily. 180 tablet 0   NEOMYCIN-POLYMYXIN-HYDROCORTISONE (CORTISPORIN) 1 % SOLN OTIC solution Apply to nail beds from procedure site twice daily after soaks 10 mL 0   pantoprazole (PROTONIX) 40 MG tablet Take 40 mg by mouth daily as needed.     No current facility-administered medications for this visit.    Allergies:   Ace inhibitors and Zofran [ondansetron hcl]    Social History:  The patient  reports that he has never smoked. He has never used smokeless tobacco. He reports that he does not drink alcohol and does not use drugs.   Family History:  The patient's family history includes Heart attack in his father.    ROS:  Please see the history of present illness.   Otherwise, review of systems are positive for none.   All other systems are reviewed and negative.    PHYSICAL EXAM: VS:  BP 132/84 (BP Location: Left Arm, Patient Position: Sitting, Cuff Size: Normal)   Pulse (!) 123   Ht 5\' 7"  (1.702 m)   Wt 156 lb (70.8 kg)   BMI 24.43 kg/m  , BMI Body mass index is 24.43 kg/m. GENERAL:  Well appearing HEENT:  Pupils equal round and reactive, fundi not visualized, oral mucosa unremarkable NECK:  No jugular venous distention, waveform within normal limits,  carotid upstroke brisk and symmetric, no bruits, no thyromegaly LYMPHATICS:  No cervical, inguinal adenopathy LUNGS:  Clear to auscultation bilaterally BACK:  No CVA tenderness CHEST:  Unremarkable HEART:  PMI not displaced or sustained,S1 and S2 within normal limits, no S3, no clicks, no rubs, no murmurs, irregular ABD:  Flat, positive bowel sounds normal in frequency in pitch, no bruits, no rebound, no guarding, no midline pulsatile mass, no hepatomegaly, no splenomegaly EXT:  2 plus pulses throughout, no edema, no cyanosis no clubbing SKIN:  No rashes no nodules NEURO:  Cranial nerves II through XII grossly intact, motor grossly intact throughout PSYCH:  Cognitively intact, oriented to person place and time    EKG:  EKG is  ordered today. The ekg ordered today demonstrates atrial fibrillation, rate 123, axis within normal limits, intervals within normal limits, premature ventricular contraction   Recent Labs: No results found for requested labs within last 365 days.    Lipid Panel    Component Value Date/Time   CHOL 162 07/04/2014 0909   TRIG 113 07/04/2014 0909   HDL 42 07/04/2014 0909   CHOLHDL 3.9 07/04/2014 0909   VLDL 23 07/04/2014 0909   LDLCALC 97 07/04/2014 0909      Wt Readings from Last 3 Encounters:  08/02/22 156 lb (70.8 kg)  06/22/19 159 lb (72.1 kg)  09/08/18 161 lb 6.4 oz (73.2 kg)      Other studies Reviewed: Additional studies/ records that were reviewed today include: Labs. Review of the above records demonstrates:  Please see elsewhere in the note.     ASSESSMENT AND PLAN:  Atrial fib: Given the borderline TSH and we will check a T3 and T4.  He is getting a 3-day monitor.  I did look at heart rates on his phone and it looks like his heart rate is often in the 60s suggesting that this is paroxysmal.  I will get an echocardiogram.  I am going to put him on metoprolol 25 mg twice daily.  Further management will be based on the results of this testing.  Mr. Robert West has a CHA2DS2 - VASc score of 1.  I am going to hold off starting Eliquis at this time.    HTN: His blood pressure is actually running high and we will see if this is better controlled with the beta blocker    Current medicines are reviewed at length with the patient today.  The patient does not have concerns regarding medicines.  The following changes have been made: As above  Labs/ tests ordered today include:   Orders Placed This Encounter  Procedures   T3   T4   LONG TERM MONITOR (3-14 DAYS)   EKG 12-Lead   ECHOCARDIOGRAM COMPLETE     Disposition:   FU with me in 2 months or sooner if needed.   Signed, Minus Breeding, MD  08/02/2022 8:52 AM    [place]

## 2022-08-02 ENCOUNTER — Encounter: Payer: Self-pay | Admitting: Cardiology

## 2022-08-02 ENCOUNTER — Ambulatory Visit: Payer: Medicare Other | Attending: Cardiology | Admitting: Cardiology

## 2022-08-02 VITALS — BP 132/84 | HR 123 | Ht 67.0 in | Wt 156.0 lb

## 2022-08-02 DIAGNOSIS — I480 Paroxysmal atrial fibrillation: Secondary | ICD-10-CM | POA: Diagnosis not present

## 2022-08-02 DIAGNOSIS — I10 Essential (primary) hypertension: Secondary | ICD-10-CM | POA: Diagnosis not present

## 2022-08-02 LAB — T4: T4, Total: 8 ug/dL (ref 4.5–12.0)

## 2022-08-02 LAB — T3: T3, Total: 149 ng/dL (ref 71–180)

## 2022-08-02 MED ORDER — METOPROLOL TARTRATE 25 MG PO TABS: 25.0000 mg | ORAL_TABLET | Freq: Two times a day (BID) | ORAL | 0 refills | Status: DC

## 2022-08-02 NOTE — Patient Instructions (Signed)
Medication Instructions:  Start Metoprolol 25 mg twice a day Continue all other medications *If you need a refill on your cardiac medications before your next appointment, please call your pharmacy*   Lab Work: T3,T4 today   Testing/Procedures: Schedule Echo  3 Day Heart Monitor  ( ZIO )    Follow-Up: At [facility], you and your health needs are our priority.  As part of our continuing mission to provide you with exceptional heart care, we have created designated Provider Care Teams.  These Care Teams include your primary Cardiologist (physician) and Advanced Practice Providers (APPs -  Physician Assistants and Nurse Practitioners) who all work together to provide you with the care you need, when you need it.  We recommend signing up for the patient portal called "MyChart".  Sign up information is provided on this After Visit Summary.  MyChart is used to connect with patients for Virtual Visits (Telemedicine).  Patients are able to view lab/test results, encounter notes, upcoming appointments, etc.  Non-urgent messages can be sent to your provider as well.   To learn more about what you can do with MyChart, go to [email or web address].    Your next appointment:  2 months    The format for your next appointment: Office   Provider:  Dr.Hochrein   Important Information About Sugar

## 2022-08-06 ENCOUNTER — Ambulatory Visit: Payer: Medicare Other | Attending: Cardiology

## 2022-08-06 DIAGNOSIS — I480 Paroxysmal atrial fibrillation: Secondary | ICD-10-CM

## 2022-08-06 DIAGNOSIS — I10 Essential (primary) hypertension: Secondary | ICD-10-CM

## 2022-08-06 NOTE — Progress Notes (Unsigned)
Enrolled patient for a 3 day Zio XT monitor to be mailed to patients home  

## 2022-08-12 DIAGNOSIS — I10 Essential (primary) hypertension: Secondary | ICD-10-CM | POA: Diagnosis not present

## 2022-08-12 DIAGNOSIS — I480 Paroxysmal atrial fibrillation: Secondary | ICD-10-CM

## 2022-08-22 ENCOUNTER — Telehealth: Payer: Self-pay | Admitting: Cardiology

## 2022-08-22 DIAGNOSIS — I480 Paroxysmal atrial fibrillation: Secondary | ICD-10-CM | POA: Diagnosis not present

## 2022-08-22 DIAGNOSIS — I10 Essential (primary) hypertension: Secondary | ICD-10-CM | POA: Diagnosis not present

## 2022-08-22 NOTE — Telephone Encounter (Signed)
Calling with abnormal results. Please advise  

## 2022-08-22 NOTE — Telephone Encounter (Signed)
Patient states he is returning a call to discuss monitor report.

## 2022-08-22 NOTE — Telephone Encounter (Signed)
Spoke with patient who stated he had no symptoms when wearing the monitor. He asked for suggestions to monitor his heart rhythm at home. Suggested [facility] Apple or Samsung watches that have the ability to use an app. Assisted patient to activate MyChart. He will keep a BP/P diary and send results.

## 2022-08-22 NOTE — Telephone Encounter (Signed)
Received call from [place] at [facility] who reported on the patient's monitor. Patient had 100% burden of afib, with one episode of 207 bmp for 60 seconds. Called patient to check on him. Left message for him to call back. Patient wore monitor from Jan 1-4. Spoke with wife. She stated patient never mentioned sob or dizziness while wearing monitor. Last week SBP 105 and 110. Recommended for patient to keep BP/P diary and to send in at least 5 days of readings for trending purposes. Spoke with Dr. Percival Spanish. Will send note to him for him to review. Rhythm in Epic.

## 2022-08-23 NOTE — Telephone Encounter (Signed)
Patient returning call.

## 2022-08-23 NOTE — Telephone Encounter (Signed)
Patient returned call. Reminded him to keep 2/26 appointment, and that there are no changes in therapy. Stated he feels fine. This AM BP 132/97, 135/89, P 52-101.

## 2022-08-23 NOTE — Telephone Encounter (Signed)
LMTCB.

## 2022-08-26 ENCOUNTER — Telehealth: Payer: Self-pay | Admitting: Cardiology

## 2022-08-26 NOTE — Telephone Encounter (Signed)
  Pt is returning call to get heart monitor result 

## 2022-08-26 NOTE — Telephone Encounter (Signed)
Minus Breeding, MD 08/25/2022  1:13 PM EST     This monitor demonstrated persistent atrial fib with a rapid rate.  I would like to see him back when I am in the office again to review.      Spoke to patient, appt scheduled for follow up.

## 2022-09-03 DIAGNOSIS — I4891 Unspecified atrial fibrillation: Secondary | ICD-10-CM | POA: Insufficient documentation

## 2022-09-03 NOTE — Progress Notes (Unsigned)
Cardiology Office Note   Date:  09/05/2022   ID:  Robert West, DOB 1950-10-20, MRN [id number]  PCP:  Aurea Graff.Marlou Sa, MD  Cardiologist:   Minus Breeding, MD Referring:  Aurea Graff.Marlou Sa, MD  Chief Complaint  Patient presents with   Atrial Fibrillation      History of Present Illness: Robert West is a 72 y.o. male who presents for evaluation of atrial fib.  I saw him in 2020 for PAF.   I saw him recently in and he had atrial fib.  Monitor demonstrated that this was persistent with a rapid rate.  I did start metoprolol 50 mg twice daily.  His blood pressure has been low.  Heart rates have been reasonably controlled.  He had an echocardiogram yesterday.  This demonstrated his ejection fraction to be 40 to 45%.  There is no baseline.  He has some moderate mitral regurgitation.  This looks to be a global hypokinesis.  He feels well.  Occasionally he feels fatigued a little breathless.  He is very active.  He does not notice any chest pressure, neck or arm discomfort.  He had no weight gain or edema.   Past Medical History:  Diagnosis Date   Benign essential hypertension 07/04/2014   GERD (gastroesophageal reflux disease)    Partial tear of subscapularis tendon 08/18/2014   Subclinical hyperthyroidism 07/06/2014    Past Surgical History:  Procedure Laterality Date   EYE SURGERY     Right eye/detached retina   TONSILLECTOMY       Current Outpatient Medications  Medication Sig Dispense Refill   apixaban (ELIQUIS) 5 MG TABS tablet Take 1 tablet (5 mg total) by mouth 2 (two) times daily. 180 tablet 3   celecoxib (CELEBREX) 200 MG capsule TAKE 1 TO 2 TABLETS BY MOUTH DAILY AS NEEDED FOR PAIN. 180 capsule 3   hydrochlorothiazide (HYDRODIURIL) 12.5 MG tablet Take 12.5 mg by mouth daily.     metoprolol tartrate (LOPRESSOR) 50 MG tablet Take 1 tablet (50 mg total) by mouth 2 (two) times daily. 180 tablet 3   pantoprazole (PROTONIX) 40 MG tablet Take 40 mg by mouth daily as needed.      acetaminophen (TYLENOL) 650 MG CR tablet Take 1 tablet (650 mg total) by mouth every 8 (eight) hours as needed for pain. 90 tablet 3   Diclofenac Sodium 2 % SOLN Place 2 sprays onto the skin 2 (two) times daily. 1 g 0   NEOMYCIN-POLYMYXIN-HYDROCORTISONE (CORTISPORIN) 1 % SOLN OTIC solution Apply to nail beds from procedure site twice daily after soaks 10 mL 0   No current facility-administered medications for this visit.    Allergies:   Ace inhibitors and Zofran [ondansetron hcl]    ROS:  Please see the history of present illness.   Otherwise, review of systems are positive for none.   All other systems are reviewed and negative.    PHYSICAL EXAM: VS:  BP 98/62 (BP Location: Left Arm, Patient Position: Sitting, Cuff Size: Normal)   Pulse 89   Ht 5\' 7"  (1.702 m)   Wt 158 lb 9.6 oz (71.9 kg)   SpO2 95%   BMI 24.84 kg/m  , BMI Body mass index is 24.84 kg/m. GENERAL:  Well appearing NECK:  No jugular venous distention, waveform within normal limits, carotid upstroke brisk and symmetric, no bruits, no thyromegaly LUNGS:  Clear to auscultation bilaterally CHEST:  Unremarkable HEART:  PMI not displaced or sustained,S1 and S2 within normal limits, no S3, no clicks,  no rubs, no murmurs, irregular ABD:  Flat, positive bowel sounds normal in frequency in pitch, no bruits, no rebound, no guarding, no midline pulsatile mass, no hepatomegaly, no splenomegaly EXT:  2 plus pulses throughout, no edema, no cyanosis no clubbing     EKG:  EKG is  ordered today. The ekg ordered today demonstrates atrial fibrillation, rate 89, axis within normal limits, intervals within normal limits, premature ventricular contraction   Recent Labs: No results found for requested labs within last 365 days.    Lipid Panel    Component Value Date/Time   CHOL 162 07/04/2014 0909   TRIG 113 07/04/2014 0909   HDL 42 07/04/2014 0909   CHOLHDL 3.9 07/04/2014 0909   VLDL 23 07/04/2014 0909   LDLCALC 97  07/04/2014 0909      Wt Readings from Last 3 Encounters:  09/05/22 158 lb 9.6 oz (71.9 kg)  08/02/22 156 lb (70.8 kg)  06/22/19 159 lb (72.1 kg)      Other studies Reviewed: Additional studies/ records that were reviewed today include: Echocardiogram. Review of the above records demonstrates:  Please see elsewhere in the note.     ASSESSMENT AND PLAN:  Atrial fib:   He seems to be persistent atrial fibrillation with rapid rate.  Thyroid studies were normal.  I am going to start him on Eliquis 5 mg twice daily.  Other meds will be changed as below.  I would refer him to EP for consideration of ablation.  Today I will increase his metoprolol to 50 mg twice daily.    MR: I will follow this after he is back in sinus rhythm.  This was moderate.    HTN: His blood pressure is running low.  I will stop his amlodipine.  I am going to increase his metoprolol to 50 mg twice daily and later can consolidate to Toprol-XL.  There is no room for other med titration at this point but I will consider Entresto next.   CM: I suspect this is nonischemic and will follow after he is back in sinus rhythm.  Current medicines are reviewed at length with the patient today.  The patient does not have concerns regarding medicines.  The following changes have been made: As above  Labs/ tests ordered today include:   Orders Placed This Encounter  Procedures   Ambulatory referral to Cardiac Electrophysiology   EKG 12-Lead     Disposition:   FU with me after EP evaluation   Signed, Minus Breeding, MD  09/05/2022 9:26 AM    [place]

## 2022-09-04 ENCOUNTER — Ambulatory Visit (HOSPITAL_COMMUNITY): Payer: Medicare Other | Attending: Cardiology

## 2022-09-04 DIAGNOSIS — I10 Essential (primary) hypertension: Secondary | ICD-10-CM

## 2022-09-04 DIAGNOSIS — I480 Paroxysmal atrial fibrillation: Secondary | ICD-10-CM | POA: Insufficient documentation

## 2022-09-04 DIAGNOSIS — I4891 Unspecified atrial fibrillation: Secondary | ICD-10-CM | POA: Diagnosis not present

## 2022-09-04 LAB — ECHOCARDIOGRAM COMPLETE
Area-P 1/2: 3.81 cm2
MV M vel: 4.84 m/s
MV Peak grad: 93.7 mmHg
Radius: 0.55 cm
S' Lateral: 3.4 cm

## 2022-09-05 ENCOUNTER — Encounter: Payer: Self-pay | Admitting: Cardiology

## 2022-09-05 ENCOUNTER — Telehealth: Payer: Self-pay | Admitting: Cardiology

## 2022-09-05 ENCOUNTER — Ambulatory Visit: Payer: Medicare Other | Attending: Cardiology | Admitting: Cardiology

## 2022-09-05 VITALS — BP 98/62 | HR 89 | Ht 67.0 in | Wt 158.6 lb

## 2022-09-05 DIAGNOSIS — I10 Essential (primary) hypertension: Secondary | ICD-10-CM | POA: Diagnosis not present

## 2022-09-05 DIAGNOSIS — I4891 Unspecified atrial fibrillation: Secondary | ICD-10-CM

## 2022-09-05 MED ORDER — METOPROLOL TARTRATE 50 MG PO TABS: 50.0000 mg | ORAL_TABLET | Freq: Two times a day (BID) | ORAL | 3 refills | Status: DC

## 2022-09-05 MED ORDER — APIXABAN 5 MG PO TABS: 5.0000 mg | ORAL_TABLET | Freq: Two times a day (BID) | ORAL | 3 refills | Status: DC

## 2022-09-05 NOTE — Telephone Encounter (Signed)
Spoke with pt, aware there is no interaction but cautioned regarding celebrex and bleeding. He reports he takes 2 tablets maybe once a month.

## 2022-09-05 NOTE — Telephone Encounter (Signed)
Pt c/o medication issue:  1. Name of Medication: apixaban (ELIQUIS) 5 MG TABS tablet   2. How are you currently taking this medication (dosage and times per day)?   Take 1 tablet (5 mg total) by mouth 2 (two) times daily.    3. Are you having a reaction (difficulty breathing--STAT)?   4. What is your medication issue? Patient is to start taking eliquis tonight, he wants to make sure it's not going to interact with the other medications his is taking,  hydrochlorothiazide (HYDRODIURIL) 12.5 MG tablet , pantoprazole (PROTONIX) 40 MG tablet, metoprolol tartrate (LOPRESSOR) 50 MG tablet, and celecoxib (CELEBREX) 200 MG capsule (this medication he just takes as needed for pain)

## 2022-09-05 NOTE — Patient Instructions (Signed)
Medication Instructions:  STOP Amlodipine 5 mg as directed Increase Metoprolol 50 mg twice daily START Eliquis 5 mg twice daily   *If you need a refill on your cardiac medications before your next appointment, please call your pharmacy*   Lab Work: NONE ordered at this time of appointment   If you have labs (blood work) drawn today and your tests are completely normal, you will receive your results only by: [place] (if you have MyChart) OR A paper copy in the mail If you have any lab test that is abnormal or we need to change your treatment, we will call you to review the results.   Testing/Procedures: NONE ordered at this time of appointment    Follow-Up: At [facility], you and your health needs are our priority.  As part of our continuing mission to provide you with exceptional heart care, we have created designated Provider Care Teams.  These Care Teams include your primary Cardiologist (physician) and Advanced Practice Providers (APPs -  Physician Assistants and Nurse Practitioners) who all work together to provide you with the care you need, when you need it.  We recommend signing up for the patient portal called "MyChart".  Sign up information is provided on this After Visit Summary.  MyChart is used to connect with patients for Virtual Visits (Telemedicine).  Patients are able to view lab/test results, encounter notes, upcoming appointments, etc.  Non-urgent messages can be sent to your provider as well.   To learn more about what you can do with MyChart, go to [email or web address].    Your next appointment:    After EP Evaluation   Provider:   Any APP    Other Instructions EP Referral placed

## 2022-09-13 DIAGNOSIS — H40013 Open angle with borderline findings, low risk, bilateral: Secondary | ICD-10-CM | POA: Diagnosis not present

## 2022-09-18 DIAGNOSIS — H40012 Open angle with borderline findings, low risk, left eye: Secondary | ICD-10-CM | POA: Diagnosis not present

## 2022-10-07 ENCOUNTER — Ambulatory Visit: Payer: Medicare Other | Admitting: Cardiology

## 2022-10-07 ENCOUNTER — Encounter: Payer: Self-pay | Admitting: Cardiology

## 2022-10-08 ENCOUNTER — Encounter: Payer: Self-pay | Admitting: Cardiology

## 2022-10-08 ENCOUNTER — Ambulatory Visit: Payer: Medicare Other | Attending: Cardiology | Admitting: Cardiology

## 2022-10-08 VITALS — BP 120/86 | HR 96 | Ht 67.0 in | Wt 158.0 lb

## 2022-10-08 DIAGNOSIS — I4819 Other persistent atrial fibrillation: Secondary | ICD-10-CM

## 2022-10-08 DIAGNOSIS — I5022 Chronic systolic (congestive) heart failure: Secondary | ICD-10-CM | POA: Diagnosis not present

## 2022-10-08 DIAGNOSIS — D6869 Other thrombophilia: Secondary | ICD-10-CM

## 2022-10-08 NOTE — Patient Instructions (Signed)
Medication Instructions:  Your physician recommends that you continue on your current medications as directed. Please refer to the Current Medication list given to you today.  *If you need a refill on your cardiac medications before your next appointment, please call your pharmacy*   Lab Work: Pre procedure labs -- see procedure instruction letter:  BMP & CBC  If you have labs (blood work) drawn today and your tests are completely normal, you will receive your results only by: [place] (if you have MyChart) OR A paper copy in the mail If you have any lab test that is abnormal or we need to change your treatment, we will call you to review the results.   Testing/Procedures: Your physician has requested that you have cardiac CT within 7 days PRIOR to your ablation. Cardiac computed tomography (CT) is a painless test that uses an x-ray machine to take clear, detailed pictures of your heart.  Please follow instruction below located under "other instructions". You will get a call from our office to schedule the date for this test.  Your physician has recommended that you have an ablation. Catheter ablation is a medical procedure used to treat some cardiac arrhythmias (irregular heartbeats). During catheter ablation, a long, thin, flexible tube is put into a blood vessel in your groin (upper thigh), or neck. This tube is called an ablation catheter. It is then guided to your heart through the blood vessel. Radio frequency waves destroy small areas of heart tissue where abnormal heartbeats may cause an arrhythmia to start.   EP Scheduler will call you to arrange this procedure for sometime this summer   Follow-Up: At [facility], you and your health needs are our priority.  As part of our continuing mission to provide you with exceptional heart care, we have created designated Provider Care Teams.  These Care Teams include your primary Cardiologist (physician) and Advanced Practice  Providers (APPs -  Physician Assistants and Nurse Practitioners) who all work together to provide you with the care you need, when you need it.  Your next appointment:   1 month(s) after your ablation  The format for your next appointment:   In Person  Provider:   AFib clinic   Thank you for choosing [facility]!!   Trinidad Curet, RN [phone]    Other Instructions   Cardiac Ablation Cardiac ablation is a procedure to destroy (ablate) some heart tissue that is sending bad signals. These bad signals cause problems in heart rhythm. The heart has many areas that make these signals. If there are problems in these areas, they can make the heart beat in a way that is not normal. Destroying some tissues can help make the heart rhythm normal. Tell your doctor about: Any allergies you have. All medicines you are taking. These include vitamins, herbs, eye drops, creams, and over-the-counter medicines. Any problems you or family members have had with medicines that make you fall asleep (anesthetics). Any blood disorders you have. Any surgeries you have had. Any medical conditions you have, such as kidney failure. Whether you are pregnant or may be pregnant. What are the risks? This is a safe procedure. But problems may occur, including: Infection. Bruising and bleeding. Bleeding into the chest. Stroke or blood clots. Damage to nearby areas of your body. Allergies to medicines or dyes. The need for a pacemaker if the normal system is damaged. Failure of the procedure to treat the problem. What happens before the procedure? Medicines Ask your doctor about:  Changing or stopping your normal medicines. This is important. Taking aspirin and ibuprofen. Do not take these medicines unless your doctor tells you to take them. Taking other medicines, vitamins, herbs, and supplements. General instructions Follow instructions from your doctor about what you cannot eat or drink. Plan  to have someone take you home from the hospital or clinic. If you will be going home right after the procedure, plan to have someone with you for 24 hours. Ask your doctor what steps will be taken to prevent infection. What happens during the procedure?  An IV tube will be put into one of your veins. You will be given a medicine to help you relax. The skin on your neck or groin will be numbed. A cut (incision) will be made in your neck or groin. A needle will be put through your cut and into a large vein. A tube (catheter) will be put into the needle. The tube will be moved to your heart. Dye may be put through the tube. This helps your doctor see your heart. Small devices (electrodes) on the tube will send out signals. A type of energy will be used to destroy some heart tissue. The tube will be taken out. Pressure will be held on your cut. This helps stop bleeding. A bandage will be put over your cut. The exact procedure may vary among doctors and hospitals. What happens after the procedure? You will be watched until you leave the hospital or clinic. This includes checking your heart rate, breathing rate, oxygen, and blood pressure. Your cut will be watched for bleeding. You will need to lie still for a few hours. Do not drive for 24 hours or as long as your doctor tells you. Summary Cardiac ablation is a procedure to destroy some heart tissue. This is done to treat heart rhythm problems. Tell your doctor about any medical conditions you may have. Tell him or her about all medicines you are taking to treat them. This is a safe procedure. But problems may occur. These include infection, bruising, bleeding, and damage to nearby areas of your body. Follow what your doctor tells you about food and drink. You may also be told to change or stop some of your medicines. After the procedure, do not drive for 24 hours or as long as your doctor tells you. This information is not intended to replace  advice given to you by your health care provider. Make sure you discuss any questions you have with your health care provider. Document Revised: 10/19/2021 Document Reviewed: 07/01/2019 Elsevier Patient Education  [place].

## 2022-10-08 NOTE — Progress Notes (Signed)
Electrophysiology Office Note   Date:  10/08/2022   ID:  Robert West, DOB 1950/12/15, MRN [id number]  PCP:  Aurea Graff.Marlou Sa, MD  Cardiologist:  Percival Spanish Primary Electrophysiologist:  Shayann Garbutt Meredith Leeds, MD    Chief Complaint: AF   History of Present Illness: Robert West is a 72 y.o. male who is being seen today for the evaluation of AF at the request of Minus Breeding, MD. Presenting today for electrophysiology evaluation.  He has a history seen for hypertension, atrial fibrillation.  He was initially seen in 2020 for atrial fibrillation.  He has since worn a cardiac monitor that showed persistent atrial fibrillation.  An echo done that showed an ejection fraction of 40 to 45%.  He is currently on metoprolol.  He occasionally feels fatigued and short of breath.  He is quite active.  He has not had chest pain, neck or arm discomfort.  He has no weight gain or edema.  Today, he denies symptoms of palpitations, chest pain, shortness of breath, orthopnea, PND, lower extremity edema, claudication, dizziness, presyncope, syncope, bleeding, or neurologic sequela. The patient is tolerating medications without difficulties.    Past Medical History:  Diagnosis Date   Benign essential hypertension 07/04/2014   GERD (gastroesophageal reflux disease)    Partial tear of subscapularis tendon 08/18/2014   Subclinical hyperthyroidism 07/06/2014   Past Surgical History:  Procedure Laterality Date   EYE SURGERY     Right eye/detached retina   TONSILLECTOMY       Current Outpatient Medications  Medication Sig Dispense Refill   apixaban (ELIQUIS) 5 MG TABS tablet Take 1 tablet (5 mg total) by mouth 2 (two) times daily. 180 tablet 3   celecoxib (CELEBREX) 200 MG capsule TAKE 1 TO 2 TABLETS BY MOUTH DAILY AS NEEDED FOR PAIN. 180 capsule 3   hydrochlorothiazide (HYDRODIURIL) 12.5 MG tablet Take 12.5 mg by mouth daily.     metoprolol tartrate (LOPRESSOR) 50 MG tablet Take 1 tablet (50 mg total)  by mouth 2 (two) times daily. 180 tablet 3   pantoprazole (PROTONIX) 40 MG tablet Take 40 mg by mouth daily as needed.     acetaminophen (TYLENOL) 650 MG CR tablet Take 1 tablet (650 mg total) by mouth every 8 (eight) hours as needed for pain. (Patient not taking: Reported on 10/08/2022) 90 tablet 3   Diclofenac Sodium 2 % SOLN Place 2 sprays onto the skin 2 (two) times daily. (Patient not taking: Reported on 10/08/2022) 1 g 0   NEOMYCIN-POLYMYXIN-HYDROCORTISONE (CORTISPORIN) 1 % SOLN OTIC solution Apply to nail beds from procedure site twice daily after soaks (Patient not taking: Reported on 10/08/2022) 10 mL 0   No current facility-administered medications for this visit.    Allergies:   Ace inhibitors and Zofran [ondansetron hcl]   Social History:  The patient  reports that he has never smoked. He has never used smokeless tobacco. He reports that he does not drink alcohol and does not use drugs.   Family History:  The patient's family history includes Heart attack in his father.    ROS:  Please see the history of present illness.   Otherwise, review of systems is positive for none.   All other systems are reviewed and negative.    PHYSICAL EXAM: VS:  BP 120/86   Pulse 96   Ht '5\' 7"'$  (1.702 m)   Wt 158 lb (71.7 kg)   SpO2 99%   BMI 24.75 kg/m  , BMI Body mass index is 24.75 kg/m.  GEN: Well nourished, well developed, in no acute distress  HEENT: normal  Neck: no JVD, carotid bruits, or masses Cardiac: irregular; no murmurs, rubs, or gallops,no edema  Respiratory:  clear to auscultation bilaterally, normal work of breathing GI: soft, nontender, nondistended, + BS MS: no deformity or atrophy  Skin: warm and dry Neuro:  Strength and sensation are intact Psych: euthymic mood, full affect  EKG:  EKG is ordered today. Personal review of the ekg ordered shows atrial fibrllation  Recent Labs: No results found for requested labs within last 365 days.    Lipid Panel     Component  Value Date/Time   CHOL 162 07/04/2014 0909   TRIG 113 07/04/2014 0909   HDL 42 07/04/2014 0909   CHOLHDL 3.9 07/04/2014 0909   VLDL 23 07/04/2014 0909   LDLCALC 97 07/04/2014 0909     Wt Readings from Last 3 Encounters:  10/08/22 158 lb (71.7 kg)  09/05/22 158 lb 9.6 oz (71.9 kg)  08/02/22 156 lb (70.8 kg)      Other studies Reviewed: Additional studies/ records that were reviewed today include: TTE 09/04/22  Review of the above records today demonstrates:   1. Left ventricular ejection fraction, by estimation, is 40 to 45%. The  left ventricle has mildly decreased function. The left ventricle  demonstrates global hypokinesis. Left ventricular diastolic parameters are  indeterminate.   2. Right ventricular systolic function is normal. The right ventricular  size is normal. There is normal pulmonary artery systolic pressure.   3. The mitral valve is normal in structure. Moderate mitral valve  regurgitation. No evidence of mitral stenosis.   4. Tricuspid valve regurgitation is mild to moderate.   5. The aortic valve is normal in structure. Aortic valve regurgitation is  not visualized. No aortic stenosis is present.   6. The inferior vena cava is normal in size with greater than 50%  respiratory variability, suggesting right atrial pressure of 3 mmHg.   Cardiac monitor 08/25/2022 personally reviewed Rhythm was atrial fib. Rate was elevated. Occasional PVCs.  ASSESSMENT AND PLAN:  1.  Persistent atrial fibrillation: Currently on Eliquis.  CHA2DS2-VASc of 2.  He unfortunately has systolic heart failure.  We discussed rhythm control which she is agreeable to.  He would prefer to avoid antiarrhythmic medications.  Due to that, plan for ablation.  Risk and benefits were discussed.  He understands the risks and is agreed to the procedure.  Risk, benefits, and alternatives to EP study and radiofrequency ablation for afib were also discussed in detail today. These risks include but are  not limited to stroke, bleeding, vascular damage, tamponade, perforation, damage to the esophagus, lungs, and other structures, pulmonary vein stenosis, worsening renal function, and death. The patient understands these risk and wishes to proceed.  We Savvas Roper therefore proceed with catheter ablation at the next available time.  Carto, ICE, anesthesia are requested for the procedure.  Lason Eveland also obtain CT PV protocol prior to the procedure to exclude LAA thrombus and further evaluate atrial anatomy.   2.  Hypertension: well controlled  3.  Chronic systolic heart failure: Potentially due to a tachycardia mediated cardiomyopathy.  Maintenance of sinus rhythm Levern Kalka be certainly necessary.  Laretha Luepke reassess after ablation.  4.  Secondary hypercoagulable state: Currently on Eliquis for atrial fibrillation.    Current medicines are reviewed at length with the patient today.   The patient does not have concerns regarding his medicines.  The following changes were made today:  none  Labs/ tests ordered today include:  Orders Placed This Encounter  Procedures   EKG 12-Lead     Disposition:   FU with Timo Hartwig 3 months  Signed, Kynadie Yaun Meredith Leeds, MD  10/08/2022 12:03 PM     [facility] [address] [place] [address] [phone] (office) [phone] (fax)

## 2022-10-16 ENCOUNTER — Telehealth: Payer: Self-pay

## 2022-10-16 ENCOUNTER — Other Ambulatory Visit: Payer: Self-pay

## 2022-10-16 DIAGNOSIS — I4819 Other persistent atrial fibrillation: Secondary | ICD-10-CM

## 2022-10-16 NOTE — Telephone Encounter (Signed)
Patient was returning call. Please advise  

## 2022-10-16 NOTE — Telephone Encounter (Signed)
Pt is scheduled for 01/09/23 @ 130.

## 2022-10-16 NOTE — Telephone Encounter (Signed)
LM for pt to call back to schedule Ablation

## 2022-10-29 ENCOUNTER — Ambulatory Visit: Payer: Medicare Other | Admitting: Cardiology

## 2022-11-05 DIAGNOSIS — K080 Exfoliation of teeth due to systemic causes: Secondary | ICD-10-CM | POA: Diagnosis not present

## 2022-11-18 ENCOUNTER — Telehealth: Payer: Self-pay | Admitting: Cardiology

## 2022-11-18 NOTE — Telephone Encounter (Signed)
Tania Ade with [employer] called in regarding an authorization request that the patient submitted for 5/30 afib ablation. She states it was denied due to a lack of clinical information. However, Tania Ade assumes a pre-authorization may not be needed since the office has not requested one. Please clarify. If pre-authorization is needed, please fax to [phone]. If questions, please return call to [phone].

## 2022-12-12 NOTE — Telephone Encounter (Signed)
Pt is calling in today stating he received a notice of denial from [facility] regarding the ablation on 5/30. He states this is due to [facility] not receiving any information from our office, stating why the procedure is needed for the pt. Please advise.

## 2022-12-25 ENCOUNTER — Ambulatory Visit: Payer: Medicare Other | Attending: Cardiology

## 2022-12-25 DIAGNOSIS — I4819 Other persistent atrial fibrillation: Secondary | ICD-10-CM | POA: Diagnosis not present

## 2022-12-26 LAB — BASIC METABOLIC PANEL
BUN/Creatinine Ratio: 21 (ref 10–24)
BUN: 32 mg/dL — ABNORMAL HIGH (ref 8–27)
CO2: 20 mmol/L (ref 20–29)
Calcium: 8.7 mg/dL (ref 8.6–10.2)
Chloride: 103 mmol/L (ref 96–106)
Creatinine, Ser: 1.51 mg/dL — ABNORMAL HIGH (ref 0.76–1.27)
Glucose: 98 mg/dL (ref 70–99)
Potassium: 4.5 mmol/L (ref 3.5–5.2)
Sodium: 139 mmol/L (ref 134–144)
eGFR: 49 mL/min/{1.73_m2} — ABNORMAL LOW (ref >59)

## 2022-12-26 LAB — CBC
Hematocrit: 44.6 % (ref 37.5–51.0)
Hemoglobin: 15.1 g/dL (ref 13.0–17.7)
MCH: 32.1 pg (ref 26.6–33.0)
MCHC: 33.9 g/dL (ref 31.5–35.7)
MCV: 95 fL (ref 79–97)
Platelets: 220 10*3/uL (ref 150–450)
RBC: 4.7 x10E6/uL (ref 4.14–5.80)
RDW: 13.6 % (ref 11.6–15.4)
WBC: 7.8 10*3/uL (ref 3.4–10.8)

## 2022-12-31 DIAGNOSIS — I4819 Other persistent atrial fibrillation: Secondary | ICD-10-CM | POA: Diagnosis not present

## 2022-12-31 DIAGNOSIS — I10 Essential (primary) hypertension: Secondary | ICD-10-CM | POA: Diagnosis not present

## 2022-12-31 DIAGNOSIS — E0590 Thyrotoxicosis, unspecified without thyrotoxic crisis or storm: Secondary | ICD-10-CM | POA: Diagnosis not present

## 2022-12-31 DIAGNOSIS — E7800 Pure hypercholesterolemia, unspecified: Secondary | ICD-10-CM | POA: Diagnosis not present

## 2022-12-31 DIAGNOSIS — I110 Hypertensive heart disease with heart failure: Secondary | ICD-10-CM | POA: Diagnosis not present

## 2022-12-31 DIAGNOSIS — D6869 Other thrombophilia: Secondary | ICD-10-CM | POA: Diagnosis not present

## 2022-12-31 DIAGNOSIS — I5022 Chronic systolic (congestive) heart failure: Secondary | ICD-10-CM | POA: Diagnosis not present

## 2023-01-01 ENCOUNTER — Telehealth (HOSPITAL_COMMUNITY): Payer: Self-pay | Admitting: *Deleted

## 2023-01-01 NOTE — Telephone Encounter (Signed)
Reaching out to patient to offer assistance regarding upcoming cardiac imaging study; pt verbalizes understanding of appt date/time, parking situation and where to check in, pre-test NPO status and medications ordered, and verified current allergies; name and call back number provided for further questions should they arise  Larey Brick RN Navigator Cardiac Imaging [facility] [phone] office [phone] cell  Patient aware to arrive at 1pm.

## 2023-01-02 ENCOUNTER — Ambulatory Visit (HOSPITAL_COMMUNITY)
Admission: RE | Admit: 2023-01-02 | Discharge: 2023-01-02 | Disposition: A | Discharge status: Home / Self Care | Payer: Medicare Other | Source: Ambulatory Visit | Attending: Cardiology | Admitting: Cardiology

## 2023-01-02 DIAGNOSIS — I4819 Other persistent atrial fibrillation: Secondary | ICD-10-CM | POA: Insufficient documentation

## 2023-01-02 MED ORDER — IOHEXOL 350 MG/ML SOLN
95.0000 mL | Freq: Once | INTRAVENOUS | Status: AC | PRN
  Administered 2023-01-02: 95 mL via INTRAVENOUS

## 2023-01-08 NOTE — Pre-Procedure Instructions (Signed)
Attempted to call patient regarding procedure instructions.  Left voicemail on the following items: Arrival time 1100 Nothing to eat or drink after midnight No meds AM of procedure Responsible person to drive you home and stay with you for 24 hrs  Have you missed any doses of anti-coagulant Eliquis- should be taken it twice a day.  If you have missed any doses please let the office know right away.  Don't take dose in the morning.

## 2023-01-09 ENCOUNTER — Encounter (HOSPITAL_COMMUNITY)
Admission: RE | Disposition: A | Discharge status: Home / Self Care | Payer: Self-pay | Source: Home / Self Care | Attending: Cardiology

## 2023-01-09 ENCOUNTER — Ambulatory Visit (HOSPITAL_COMMUNITY)
Admission: RE | Admit: 2023-01-09 | Discharge: 2023-01-09 | Disposition: A | Discharge status: Home / Self Care | Payer: Medicare Other | Attending: Cardiology | Admitting: Cardiology

## 2023-01-09 ENCOUNTER — Telehealth: Payer: Self-pay | Admitting: Cardiology

## 2023-01-09 ENCOUNTER — Ambulatory Visit (HOSPITAL_COMMUNITY): Payer: Medicare Other | Admitting: Anesthesiology

## 2023-01-09 ENCOUNTER — Other Ambulatory Visit: Payer: Self-pay

## 2023-01-09 ENCOUNTER — Ambulatory Visit (HOSPITAL_BASED_OUTPATIENT_CLINIC_OR_DEPARTMENT_OTHER): Payer: Medicare Other | Admitting: Anesthesiology

## 2023-01-09 DIAGNOSIS — I4891 Unspecified atrial fibrillation: Secondary | ICD-10-CM

## 2023-01-09 DIAGNOSIS — E0590 Thyrotoxicosis, unspecified without thyrotoxic crisis or storm: Secondary | ICD-10-CM

## 2023-01-09 DIAGNOSIS — Z8249 Family history of ischemic heart disease and other diseases of the circulatory system: Secondary | ICD-10-CM | POA: Diagnosis not present

## 2023-01-09 DIAGNOSIS — I10 Essential (primary) hypertension: Secondary | ICD-10-CM | POA: Diagnosis not present

## 2023-01-09 DIAGNOSIS — I4819 Other persistent atrial fibrillation: Secondary | ICD-10-CM | POA: Insufficient documentation

## 2023-01-09 HISTORY — PX: ATRIAL FIBRILLATION ABLATION: EP1191

## 2023-01-09 LAB — POCT ACTIVATED CLOTTING TIME
Activated Clotting Time: 287 seconds
Activated Clotting Time: 336 seconds

## 2023-01-09 SURGERY — ATRIAL FIBRILLATION ABLATION
Anesthesia: General

## 2023-01-09 MED ORDER — PROTAMINE SULFATE 10 MG/ML IV SOLN
INTRAVENOUS | Status: DC | PRN
  Administered 2023-01-09: 40 mg via INTRAVENOUS

## 2023-01-09 MED ORDER — ACETAMINOPHEN 325 MG PO TABS: 650.0000 mg | ORAL_TABLET | ORAL | Status: DC | PRN

## 2023-01-09 MED ORDER — HEPARIN SODIUM (PORCINE) 1000 UNIT/ML IJ SOLN
INTRAMUSCULAR | Status: AC
  Filled 2023-01-09: qty 10

## 2023-01-09 MED ORDER — HEPARIN (PORCINE) IN NACL 1000-0.9 UT/500ML-% IV SOLN
INTRAVENOUS | Status: DC | PRN
  Administered 2023-01-09 (×4): 500 mL

## 2023-01-09 MED ORDER — PHENYLEPHRINE HCL-NACL 20-0.9 MG/250ML-% IV SOLN
INTRAVENOUS | Status: DC | PRN
  Administered 2023-01-09: 25 ug/min via INTRAVENOUS

## 2023-01-09 MED ORDER — HEPARIN SODIUM (PORCINE) 1000 UNIT/ML IJ SOLN
INTRAMUSCULAR | Status: DC | PRN
  Administered 2023-01-09: 1000 [IU] via INTRAVENOUS
  Administered 2023-01-09: 14000 [IU] via INTRAVENOUS
  Administered 2023-01-09: 5000 [IU] via INTRAVENOUS

## 2023-01-09 MED ORDER — SODIUM CHLORIDE 0.9% FLUSH: 3.0000 mL | INTRAVENOUS | Status: DC | PRN

## 2023-01-09 MED ORDER — LIDOCAINE 2% (20 MG/ML) 5 ML SYRINGE
INTRAMUSCULAR | Status: DC | PRN
  Administered 2023-01-09: 100 mg via INTRAVENOUS

## 2023-01-09 MED ORDER — METOPROLOL TARTRATE 50 MG PO TABS: 50.0000 mg | ORAL_TABLET | Freq: Two times a day (BID) | ORAL | 3 refills | Status: DC

## 2023-01-09 MED ORDER — SODIUM CHLORIDE 0.9 % IV SOLN: INTRAVENOUS | Status: DC

## 2023-01-09 MED ORDER — SUGAMMADEX SODIUM 200 MG/2ML IV SOLN
INTRAVENOUS | Status: DC | PRN
  Administered 2023-01-09: 150 mg via INTRAVENOUS

## 2023-01-09 MED ORDER — PROPOFOL 10 MG/ML IV BOLUS
INTRAVENOUS | Status: DC | PRN
  Administered 2023-01-09: 150 mg via INTRAVENOUS

## 2023-01-09 MED ORDER — SODIUM CHLORIDE 0.9 % IV SOLN: 250.0000 mL | INTRAVENOUS | Status: DC | PRN

## 2023-01-09 MED ORDER — HEPARIN SODIUM (PORCINE) 1000 UNIT/ML IJ SOLN
INTRAMUSCULAR | Status: DC | PRN
  Administered 2023-01-09: 1000 [IU] via INTRAVENOUS

## 2023-01-09 MED ORDER — DEXAMETHASONE SODIUM PHOSPHATE 10 MG/ML IJ SOLN
INTRAMUSCULAR | Status: DC | PRN
  Administered 2023-01-09: 10 mg via INTRAVENOUS

## 2023-01-09 MED ORDER — FENTANYL CITRATE (PF) 250 MCG/5ML IJ SOLN
INTRAMUSCULAR | Status: DC | PRN
  Administered 2023-01-09: 50 ug via INTRAVENOUS
  Administered 2023-01-09 (×2): 25 ug via INTRAVENOUS

## 2023-01-09 MED ORDER — ROCURONIUM BROMIDE 10 MG/ML (PF) SYRINGE
PREFILLED_SYRINGE | INTRAVENOUS | Status: DC | PRN
  Administered 2023-01-09: 60 mg via INTRAVENOUS

## 2023-01-09 MED ORDER — SODIUM CHLORIDE 0.9% FLUSH: 3.0000 mL | Freq: Two times a day (BID) | INTRAVENOUS | Status: DC

## 2023-01-09 SURGICAL SUPPLY — 22 items
BAG SNAP BAND KOVER 36X36 (MISCELLANEOUS) IMPLANT
BLANKET WARM UNDERBOD FULL ACC (MISCELLANEOUS) ×1 IMPLANT
CATH 8FR REPROCESSED SOUNDSTAR (CATHETERS) ×1 IMPLANT
CATH 8FR SOUNDSTAR REPROCESSED (CATHETERS) IMPLANT
CATH ABLAT QDOT MICRO BI TC DF (CATHETERS) IMPLANT
CATH OCTARAY 2.0 F 3-3-3-3-3 (CATHETERS) IMPLANT
CATH PIGTAIL STEERABLE D1 8.7 (WIRE) IMPLANT
CATH S-M CIRCA TEMP PROBE (CATHETERS) IMPLANT
CATH WEBSTER BI DIR CS D-F CRV (CATHETERS) IMPLANT
CLOSURE MYNX CONTROL 6F/7F (Vascular Products) IMPLANT
CLOSURE PERCLOSE PROSTYLE (VASCULAR PRODUCTS) IMPLANT
COVER SWIFTLINK CONNECTOR (BAG) ×1 IMPLANT
PACK EP LATEX FREE (CUSTOM PROCEDURE TRAY) ×1
PACK EP LF (CUSTOM PROCEDURE TRAY) ×1 IMPLANT
PAD DEFIB RADIO PHYSIO CONN (PAD) ×1 IMPLANT
PATCH CARTO3 (PAD) IMPLANT
SHEATH CARTO VIZIGO SM CVD (SHEATH) IMPLANT
SHEATH PINNACLE 7F 10CM (SHEATH) IMPLANT
SHEATH PINNACLE 8F 10CM (SHEATH) IMPLANT
SHEATH PINNACLE 9F 10CM (SHEATH) IMPLANT
SHEATH PROBE COVER 6X72 (BAG) IMPLANT
TUBING SMART ABLATE COOLFLOW (TUBING) IMPLANT

## 2023-01-09 NOTE — Telephone Encounter (Signed)
*  STAT* If patient is at the pharmacy, call can be transferred to refill team.   1. Which medications need to be refilled? (please list name of each medication and dose if known)   metoprolol tartrate (LOPRESSOR) 50 MG tablet   2. Which pharmacy/location (including street and city if local pharmacy) is medication to be sent to?  [id number] - [place], [place] - [address]   3. Do they need a 30 day or 90 day supply?   90 day  Patient stated he has 6 tablets left.

## 2023-01-09 NOTE — Discharge Instructions (Addendum)
Cardiac Ablation, Care After  This sheet gives you information about how to care for yourself after your procedure. Your health care provider may also give you more specific instructions. If you have problems or questions, contact your health care provider. What can I expect after the procedure? After the procedure, it is common to have: Bruising around your puncture site. Tenderness around your puncture site. Skipped heartbeats. If you had an atrial fibrillation ablation, you may have atrial fibrillation during the first several months after your procedure.  Tiredness (fatigue).  Follow these instructions at home: Puncture site care  Follow instructions from your health care provider about how to take care of your puncture site. Make sure you: If present, leave stitches (sutures), skin glue, or adhesive strips in place. These skin closures may need to stay in place for up to 2 weeks. If adhesive strip edges start to loosen and curl up, you may trim the loose edges. Do not remove adhesive strips completely unless your health care provider tells you to do that. If a large square bandage is present, this may be removed 24 hours after surgery.  Check your puncture site every day for signs of infection. Check for: Redness, swelling, or pain. Fluid or blood. If your puncture site starts to bleed, lie down on your back, apply firm pressure to the area, and contact your health care provider. Warmth. Pus or a bad smell. A pea or small marble sized lump at the site is normal and can take up to three months to resolve.  Driving Do not drive for at least 4 days after your procedure or however long your health care provider recommends. (Do not resume driving if you have previously been instructed not to drive for other health reasons.) Do not drive or use heavy machinery while taking prescription pain medicine. Activity Avoid activities that take a lot of effort for at least 7 days after your  procedure. Do not lift anything that is heavier than 5 lb (4.5 kg) for one week.  No sexual activity for 1 week.  Return to your normal activities as told by your health care provider. Ask your health care provider what activities are safe for you. General instructions Take over-the-counter and prescription medicines only as told by your health care provider. Do not use any products that contain nicotine or tobacco, such as cigarettes and e-cigarettes. If you need help quitting, ask your health care provider. You may shower after 24 hours, but Do not take baths, swim, or use a hot tub for 1 week.  Do not drink alcohol for 24 hours after your procedure. Keep all follow-up visits as told by your health care provider. This is important. Contact a health care provider if: You have redness, mild swelling, or pain around your puncture site. You have fluid or blood coming from your puncture site that stops after applying firm pressure to the area. Your puncture site feels warm to the touch. You have pus or a bad smell coming from your puncture site. You have a fever. You have chest pain or discomfort that spreads to your neck, jaw, or arm. You have chest pain that is worse with lying on your back or taking a deep breath. You are sweating a lot. You feel nauseous. You have a fast or irregular heartbeat. You have shortness of breath. You are dizzy or light-headed and feel the need to lie down. You have pain or numbness in the arm or leg closest to your puncture   site. Get help right away if: Your puncture site suddenly swells. Your puncture site is bleeding and the bleeding does not stop after applying firm pressure to the area. These symptoms may represent a serious problem that is an emergency. Do not wait to see if the symptoms will go away. Get medical help right away. Call your local emergency services (911 in the [place] Do not drive yourself to the hospital. Summary After the procedure, it  is normal to have bruising and tenderness at the puncture site in your groin, neck, or forearm. Check your puncture site every day for signs of infection. Get help right away if your puncture site is bleeding and the bleeding does not stop after applying firm pressure to the area. This is a medical emergency. This information is not intended to replace advice given to you by your health care provider. Make sure you discuss any questions you have with your health care provider.   You have an appointment set up with the Atrial Fibrillation Clinic.  Multiple studies have shown that being followed by a dedicated atrial fibrillation clinic in addition to the standard care you receive from your other physicians improves health. We believe that enrollment in the atrial fibrillation clinic will allow us to better care for you.   The phone number to the Atrial Fibrillation Clinic is [phone]. The clinic is staffed Monday through Friday from 8:30am to 5pm.  Directions: The clinic is located in the [facility], 6TH FLOOR Enter the hospital at the MAIN ENTRANCE "A", use North Tower Elevators to the 6th floor.  Registration desk to the right of elevators on 6th floor  If you have any trouble locating the clinic, please don't hesitate to call [phone].   

## 2023-01-09 NOTE — Transfer of Care (Signed)
Immediate Anesthesia Transfer of Care Note  Patient: Mammoth Blas  Procedure(s) Performed: ATRIAL FIBRILLATION ABLATION  Patient Location: Cath Lab  Anesthesia Type:General  Level of Consciousness: awake, alert , oriented, patient cooperative, and responds to stimulation  Airway & Oxygen Therapy: Patient Spontanous Breathing and Patient connected to nasal cannula oxygen  Post-op Assessment: Report given to RN and Post -op Vital signs reviewed and stable  Post vital signs: Reviewed and stable  Last Vitals:  Vitals Value Taken Time  BP 116/70 01/09/23 1456  Temp    Pulse 80 01/09/23 1458  Resp 18 01/09/23 1458  SpO2 94 % 01/09/23 1458  Vitals shown include unvalidated device data.  Last Pain:  Vitals:   01/09/23 1130  TempSrc: Temporal  PainSc: 0-No pain         Complications: There were no known notable events for this encounter.

## 2023-01-09 NOTE — H&P (Signed)
Electrophysiology Office Note   Date:  01/09/2023   ID:  Robert West, DOB 10/03/1950, MRN [id number]  PCP:  Irven Coe, MD  Cardiologist:  Hochrein Primary Electrophysiologist:  Zoria Rawlinson Jorja Loa, MD    Chief Complaint: AF   History of Present Illness: Robert West is a 72 y.o. male who is being seen today for the evaluation of AF at the request of No ref. provider found. Presenting today for electrophysiology evaluation.  He has a history seen for hypertension, atrial fibrillation.  He was initially seen in 2020 for atrial fibrillation.  He has since worn a cardiac monitor that showed persistent atrial fibrillation.  An echo done that showed an ejection fraction of 40 to 45%.  He is currently on metoprolol.  He occasionally feels fatigued and short of breath.  He is quite active.  He has not had chest pain, neck or arm discomfort.  He has no weight gain or edema.  Today, denies symptoms of palpitations, chest pain, shortness of breath, orthopnea, PND, lower extremity edema, claudication, dizziness, presyncope, syncope, bleeding, or neurologic sequela. The patient is tolerating medications without difficulties. Plan AF ablation today.    Past Medical History:  Diagnosis Date   Benign essential hypertension 07/04/2014   GERD (gastroesophageal reflux disease)    Partial tear of subscapularis tendon 08/18/2014   Subclinical hyperthyroidism 07/06/2014   Past Surgical History:  Procedure Laterality Date   EYE SURGERY     Right eye/detached retina   TONSILLECTOMY       Current Facility-Administered Medications  Medication Dose Route Frequency Provider Last Rate Last Admin   0.9 %  sodium chloride infusion   Intravenous Continuous Rosemarie Galvis, Andree Coss, MD        Allergies:   Ace inhibitors and Zofran [ondansetron hcl]   Social History:  The patient  reports that he has never smoked. He has never used smokeless tobacco. He reports that he does not drink alcohol and does not use  drugs.   Family History:  The patient's family history includes Heart attack in his father.   ROS:  Please see the history of present illness.   Otherwise, review of systems is positive for none.   All other systems are reviewed and negative.   PHYSICAL EXAM: VS:  BP (!) 144/93   Pulse 98   Temp 98.6 F (37 C) (Temporal)   Resp 12   Ht 5\' 7"  (1.702 m)   Wt 71.7 kg   SpO2 99%   BMI 24.75 kg/m  , BMI Body mass index is 24.75 kg/m. GEN: Well nourished, well developed, in no acute distress  HEENT: normal  Neck: no JVD, carotid bruits, or masses Cardiac: irregular; no murmurs, rubs, or gallops,no edema  Respiratory:  clear to auscultation bilaterally, normal work of breathing GI: soft, nontender, nondistended, + BS MS: no deformity or atrophy  Skin: warm and dry Neuro:  Strength and sensation are intact Psych: euthymic mood, full affect  Recent Labs: 12/25/2022: BUN 32; Creatinine, Ser 1.51; Hemoglobin 15.1; Platelets 220; Potassium 4.5; Sodium 139    Lipid Panel     Component Value Date/Time   CHOL 162 07/04/2014 0909   TRIG 113 07/04/2014 0909   HDL 42 07/04/2014 0909   CHOLHDL 3.9 07/04/2014 0909   VLDL 23 07/04/2014 0909   LDLCALC 97 07/04/2014 0909     Wt Readings from Last 3 Encounters:  01/09/23 71.7 kg  10/08/22 71.7 kg  09/05/22 71.9 kg  Other studies Reviewed: Additional studies/ records that were reviewed today include: TTE 09/04/22  Review of the above records today demonstrates:   1. Left ventricular ejection fraction, by estimation, is 40 to 45%. The  left ventricle has mildly decreased function. The left ventricle  demonstrates global hypokinesis. Left ventricular diastolic parameters are  indeterminate.   2. Right ventricular systolic function is normal. The right ventricular  size is normal. There is normal pulmonary artery systolic pressure.   3. The mitral valve is normal in structure. Moderate mitral valve  regurgitation. No evidence of  mitral stenosis.   4. Tricuspid valve regurgitation is mild to moderate.   5. The aortic valve is normal in structure. Aortic valve regurgitation is  not visualized. No aortic stenosis is present.   6. The inferior vena cava is normal in size with greater than 50%  respiratory variability, suggesting right atrial pressure of 3 mmHg.   Cardiac monitor 08/25/2022 personally reviewed Rhythm was atrial fib. Rate was elevated. Occasional PVCs.  ASSESSMENT AND PLAN:  1.  Persistent atrial fibrillation: [name] has presented today for surgery, with the diagnosis of AF.  The various methods of treatment have been discussed with the patient and family. After consideration of risks, benefits and other options for treatment, the patient has consented to  Procedure(s): Catheter ablation as a surgical intervention .  Risks include but not limited to complete heart block, stroke, esophageal damage, nerve damage, bleeding, vascular damage, tamponade, perforation, MI, and death. The patient's history has been reviewed, patient examined, no change in status, stable for surgery.  I have reviewed the patient's chart and labs.  Questions were answered to the patient's satisfaction.    Candid Bovey Elberta Fortis, MD 01/09/2023 11:31 AM

## 2023-01-09 NOTE — Anesthesia Procedure Notes (Signed)
Procedure Name: Intubation Date/Time: 01/09/2023 12:34 PM  Performed by: Quentin Ore, CRNAPre-anesthesia Checklist: Patient identified, Emergency Drugs available, Suction available and Patient being monitored Patient Re-evaluated:Patient Re-evaluated prior to induction Oxygen Delivery Method: Circle system utilized Preoxygenation: Pre-oxygenation with 100% oxygen Induction Type: IV induction Ventilation: Mask ventilation without difficulty Laryngoscope Size: Mac and 3 Grade View: Grade I Tube type: Oral Tube size: 7.5 mm Number of attempts: 1 Airway Equipment and Method: Stylet Placement Confirmation: ETT inserted through vocal cords under direct vision, positive ETCO2 and breath sounds checked- equal and bilateral Secured at: 22 cm Tube secured with: Tape Dental Injury: Teeth and Oropharynx as per pre-operative assessment

## 2023-01-09 NOTE — Anesthesia Preprocedure Evaluation (Signed)
Anesthesia Evaluation  Patient identified by MRN, date of birth, ID band Patient awake    Reviewed: Allergy & Precautions, H&P , NPO status , Patient's Chart, lab work & pertinent test results  Airway Mallampati: II  TM Distance: >3 FB Neck ROM: Full    Dental no notable dental hx.    Pulmonary neg pulmonary ROS   Pulmonary exam normal breath sounds clear to auscultation       Cardiovascular hypertension, Pt. on medications and Pt. on home beta blockers Normal cardiovascular exam+ dysrhythmias Atrial Fibrillation  Rhythm:Irregular Rate:Normal     Neuro/Psych negative neurological ROS  negative psych ROS   GI/Hepatic Neg liver ROS,GERD  ,,  Endo/Other   Hyperthyroidism   Renal/GU negative Renal ROS  negative genitourinary   Musculoskeletal negative musculoskeletal ROS (+)    Abdominal   Peds negative pediatric ROS (+)  Hematology negative hematology ROS (+)   Anesthesia Other Findings   Reproductive/Obstetrics negative OB ROS                             Anesthesia Physical Anesthesia Plan  ASA: 3  Anesthesia Plan: General   Post-op Pain Management: Minimal or no pain anticipated   Induction: Intravenous  PONV Risk Score and Plan: 2 and Ondansetron, Dexamethasone and Treatment may vary due to age or medical condition  Airway Management Planned: Oral ETT  Additional Equipment:   Intra-op Plan:   Post-operative Plan: Extubation in OR  Informed Consent: I have reviewed the patients History and Physical, chart, labs and discussed the procedure including the risks, benefits and alternatives for the proposed anesthesia with the patient or authorized representative who has indicated his/her understanding and acceptance.     Dental advisory given  Plan Discussed with: CRNA and Surgeon  Anesthesia Plan Comments:        Anesthesia Quick Evaluation

## 2023-01-10 ENCOUNTER — Encounter (HOSPITAL_COMMUNITY): Payer: Self-pay | Admitting: Cardiology

## 2023-01-10 NOTE — Anesthesia Postprocedure Evaluation (Signed)
Anesthesia Post Note  Patient: Robert West  Procedure(s) Performed: ATRIAL FIBRILLATION ABLATION     Patient location during evaluation: PACU Anesthesia Type: General Level of consciousness: awake and alert Pain management: pain level controlled Vital Signs Assessment: post-procedure vital signs reviewed and stable Respiratory status: spontaneous breathing, nonlabored ventilation, respiratory function stable and patient connected to nasal cannula oxygen Cardiovascular status: blood pressure returned to baseline and stable Postop Assessment: no apparent nausea or vomiting Anesthetic complications: no   There were no known notable events for this encounter.  Last Vitals:  Vitals:   01/09/23 1825 01/09/23 1830  BP: (!) 153/96 136/82  Pulse: 84 84  Resp:    Temp:    SpO2: 96% 99%    Last Pain:  Vitals:   01/10/23 1456  TempSrc:   PainSc: 0-No pain                 Kennieth Rad

## 2023-01-13 ENCOUNTER — Encounter: Payer: Self-pay | Admitting: Cardiology

## 2023-01-13 ENCOUNTER — Telehealth: Payer: Self-pay | Admitting: Cardiology

## 2023-01-13 ENCOUNTER — Ambulatory Visit (HOSPITAL_COMMUNITY)
Admission: RE | Admit: 2023-01-13 | Discharge: 2023-01-13 | Disposition: A | Discharge status: Home / Self Care | Payer: Medicare Other | Source: Ambulatory Visit | Attending: Internal Medicine | Admitting: Internal Medicine

## 2023-01-13 DIAGNOSIS — I4891 Unspecified atrial fibrillation: Secondary | ICD-10-CM | POA: Insufficient documentation

## 2023-01-13 DIAGNOSIS — I4819 Other persistent atrial fibrillation: Secondary | ICD-10-CM

## 2023-01-13 DIAGNOSIS — R58 Hemorrhage, not elsewhere classified: Secondary | ICD-10-CM | POA: Diagnosis not present

## 2023-01-13 NOTE — Telephone Encounter (Signed)
[place]ame, Ram - 01/13/2023  8:06 AM Camnitz, Will Kasra Melvin, MD ([phone])  Sent: Mon January 13, 2023  1:20 PM  To: Carter, Stacy S, RN  Cc: P Cv Div Ch St Triage         Message  Needs AF clinic follow up to eval leg sites    Spoke with our afib clinic and they can see the pt today at 3 pm or tomorrow 6/4 at 0830.  Tried calling the pt back to endorse both appt times and he did not answer.  Did leave him a detailed message to call our office back and ask to speak with a triage nurse for further assistance with this.  

## 2023-01-13 NOTE — Telephone Encounter (Signed)
Pt says he had ablation 01/09/23 and he has bruising on both thighs.. he says it is larger than a hand and extends to his mid thigh... there is no swelling, heat, oozing and no pain. He has no sx of weakness or infection.   I advised him to continue to monitor... to mark the outer edge and see if it gets any worse. He has pics that he will try to send through My Chart for Dr Elberta Fortis to see.   I advised him that bruising is normal and to let us know if anything changes or worsens.   I will forward to Dr Elberta Fortis for his review and if any further recommendations.

## 2023-01-13 NOTE — Progress Notes (Signed)
Patient is here for a groin check s/p Afib ablation by Dr. Elberta Fortis on 01/09/23. He noted painless bruising both groin sites, R > L. He notes that compared to the beginning of the weekend, the bruising on the right side is a little improved. His left upper thigh is still a little sore but better. He denies any fever or oozing.   He has 2+ bilateral posterior tibialis pulses. Thighs are symmetrical bilaterally with no swelling noted. Ecchymosis greater R > L. Not tender to palpation. Continue wound care as directed post ablation. No indication today for groin ultrasound. Precautions given to call, otherwise follow up as scheduled with Ricky.

## 2023-01-13 NOTE — Telephone Encounter (Signed)
Pt sent a mychart message back confirming he could go and see afib clinic today at 3 pm for this matter.  Afib clinic aware that he will see them today and will arrange this appt for the pt.   Pt aware of where to report to when going to see afib clinic, via [employer].

## 2023-01-13 NOTE — Telephone Encounter (Signed)
Patient is calling stating he has bruising on both legs with pain in the left as well as what appears to be internal bleeding.   He reports this began Friday morning, the day after his procedure.   He is requesting to be worked in for an appointment with Dr. Elberta Fortis today.  Please advise.

## 2023-01-13 NOTE — Telephone Encounter (Signed)
Left a message for the pt to call back.  

## 2023-01-13 NOTE — Telephone Encounter (Signed)
Jhamal, Polmanteer - 01/13/2023  8:06 AM Regan Lemming, MD [phone])  Sent: Sheral Flow January 13, 2023  1:20 PM  To: Shona Simpson, RN  Cc: Vida Rigger Div Ch St Triage         Message  Needs AF clinic follow up to eval leg sites    Spoke with our afib clinic and they can see the pt today at 3 pm or tomorrow 6/4 at 0830.  Tried calling the pt back to endorse both appt times and he did not answer.  Did leave him a detailed message to call our office back and ask to speak with a triage nurse for further assistance with this.

## 2023-02-06 ENCOUNTER — Ambulatory Visit (HOSPITAL_COMMUNITY)
Admission: RE | Admit: 2023-02-06 | Discharge: 2023-02-06 | Disposition: A | Discharge status: Home / Self Care | Payer: Medicare Other | Source: Ambulatory Visit | Attending: Physician Assistant | Admitting: Physician Assistant

## 2023-02-06 ENCOUNTER — Encounter (HOSPITAL_COMMUNITY): Payer: Self-pay | Admitting: Physician Assistant

## 2023-02-06 VITALS — BP 124/78 | HR 60 | Ht 67.0 in | Wt 159.4 lb

## 2023-02-06 DIAGNOSIS — I4819 Other persistent atrial fibrillation: Secondary | ICD-10-CM | POA: Diagnosis not present

## 2023-02-06 DIAGNOSIS — I38 Endocarditis, valve unspecified: Secondary | ICD-10-CM | POA: Insufficient documentation

## 2023-02-06 DIAGNOSIS — Z7901 Long term (current) use of anticoagulants: Secondary | ICD-10-CM | POA: Insufficient documentation

## 2023-02-06 DIAGNOSIS — D6869 Other thrombophilia: Secondary | ICD-10-CM | POA: Diagnosis not present

## 2023-02-06 DIAGNOSIS — I110 Hypertensive heart disease with heart failure: Secondary | ICD-10-CM | POA: Insufficient documentation

## 2023-02-06 NOTE — Progress Notes (Signed)
Primary Care Physician: Irven Coe, MD Primary Cardiologist: Rollene Rotunda, MD Electrophysiologist: Will Jorja Loa, MD  Referring Physician: Dr Jamas Lav is a 72 y.o. male with a history of HTN, HFrEF, and atrial fibrillation who presents for follow up in the [facility]. He was initially seen in 2020 for atrial fibrillation. He has since worn a cardiac monitor that showed persistent atrial fibrillation. An echo done that showed an ejection fraction of 40 to 45%. Patient is on Eliquis for a CHADS2VASC score of 3. Seen by Dr Elberta Fortis and underwent afib ablation on 01/09/23. He did have some groin bruising and was seen by Landry Mellow PA on 6/3, felt to be normal post ablation convalescence, no imaging ordered at that time.   On follow up today, patient reports that he has done well since the ablation. He has not had any known episodes of afib. He denies chest pain, swallowing pain, or groin issues. He is concerned about his bleeding risk on long term anticoagulation.   Today, he denies symptoms of palpitations, chest pain, shortness of breath, orthopnea, PND, lower extremity edema, dizziness, presyncope, syncope, snoring, daytime somnolence, bleeding, or neurologic sequela. The patient is tolerating medications without difficulties and is otherwise without complaint today.    Atrial Fibrillation Risk Factors:  he does not have symptoms or diagnosis of sleep apnea. he does not have a history of rheumatic fever. The patient does not have a history of early familial atrial fibrillation or other arrhythmias.  Atrial Fibrillation Management history:  Previous antiarrhythmic drugs: none Previous cardioversions: none Previous ablations: 01/09/23 Anticoagulation history: Eliquis  ROS- All systems are reviewed and negative except as per the HPI above.   Physical Exam: BP 124/78   Pulse 60   Ht 5\' 7"  (1.702 m)   Wt 72.3 kg   BMI 24.97 kg/m    GEN: Well nourished, well developed in no acute distress NECK: No JVD; No carotid bruits CARDIAC: Regular rate and rhythm, no murmurs, rubs, gallops RESPIRATORY:  Clear to auscultation without rales, wheezing or rhonchi  ABDOMEN: Soft, non-tender, non-distended EXTREMITIES:  No edema; No deformity   Wt Readings from Last 3 Encounters:  02/06/23 72.3 kg  01/09/23 71.7 kg  10/08/22 71.7 kg     EKG today demonstrates  SR Vent. rate 60 BPM PR interval 130 ms QRS duration 86 ms QT/QTcB 410/410 ms  Echo 09/04/22 demonstrated  1. Left ventricular ejection fraction, by estimation, is 40 to 45%. The  left ventricle has mildly decreased function. The left ventricle  demonstrates global hypokinesis. Left ventricular diastolic parameters are  indeterminate.   2. Right ventricular systolic function is normal. The right ventricular  size is normal. There is normal pulmonary artery systolic pressure.   3. The mitral valve is normal in structure. Moderate mitral valve  regurgitation. No evidence of mitral stenosis.   4. Tricuspid valve regurgitation is mild to moderate.   5. The aortic valve is normal in structure. Aortic valve regurgitation is  not visualized. No aortic stenosis is present.   6. The inferior vena cava is normal in size with greater than 50%  respiratory variability, suggesting right atrial pressure of 3 mmHg.    CHA2DS2-VASc Score = 3  The patient's score is based upon: CHF History: 1 HTN History: 1 Diabetes History: 0 Stroke History: 0 Vascular Disease History: 0 Age Score: 1 Gender Score: 0       ASSESSMENT AND PLAN: Persistent Atrial  Fibrillation (ICD10:  I48.19) The patient's CHA2DS2-VASc score is 3, indicating a 3.2% annual risk of stroke.   S/p afib ablation 01/09/23 Patient appears to be maintaining SR.  Continue Eliquis 5 mg BID with no missed doses for 3 months post ablation. Continue Lopressor 50 mg BID  Secondary Hypercoagulable State (ICD10:   D68.69) The patient is at significant risk for stroke/thromboembolism based upon his CHA2DS2-VASc Score of 3.  Continue Apixaban (Eliquis). Patient concerned about his bleeding risk long term. We discussed Watchman today, brochure given. He plans to discuss with Dr Elberta Fortis at follow up.  HFrEF EF 40-45% Fluid status appears stable Hopefully EF improves with SR  Valvular heart disease Moderate MR Would reassess in SR  HTN Stable on current regimen   Follow up with Dr Elberta Fortis as scheduled.    Jorja Loa PA-C Afib [facility] [address] [place], [place] [address] [phone]

## 2023-03-06 ENCOUNTER — Encounter: Payer: Self-pay | Admitting: *Deleted

## 2023-03-06 ENCOUNTER — Telehealth: Payer: Self-pay | Admitting: *Deleted

## 2023-03-06 DIAGNOSIS — H02831 Dermatochalasis of right upper eyelid: Secondary | ICD-10-CM | POA: Diagnosis not present

## 2023-03-06 DIAGNOSIS — H401232 Low-tension glaucoma, bilateral, moderate stage: Secondary | ICD-10-CM | POA: Diagnosis not present

## 2023-03-06 DIAGNOSIS — H25813 Combined forms of age-related cataract, bilateral: Secondary | ICD-10-CM | POA: Diagnosis not present

## 2023-03-06 DIAGNOSIS — H524 Presbyopia: Secondary | ICD-10-CM | POA: Diagnosis not present

## 2023-03-06 DIAGNOSIS — H35033 Hypertensive retinopathy, bilateral: Secondary | ICD-10-CM | POA: Diagnosis not present

## 2023-03-06 NOTE — Telephone Encounter (Signed)
After several unsuccessful attempts to reach pt about May blood work results, letter mailed to home address asking pt to call office to discuss.

## 2023-03-06 NOTE — Telephone Encounter (Signed)
Pt called back. Informed me that he is seeing PCP next week and will have performed at that office.

## 2023-03-10 ENCOUNTER — Telehealth: Payer: Self-pay | Admitting: Cardiology

## 2023-03-10 NOTE — Telephone Encounter (Signed)
Returned pt call in regards to his Eliquis. Pt's insurance does not cover this medication or the generic brand. Suggested to pt to call his pharmacy and ask them to run it through GoodRx and see what kind of discount he can get. Pt verbalized understanding.

## 2023-03-10 NOTE — Telephone Encounter (Signed)
Pt c/o medication issue:  1. Name of Medication: apixaban (ELIQUIS) 5 MG TABS tablet   2. How are you currently taking this medication (dosage and times per day)?   Take 1 tablet (5 mg total) by mouth 2 (two) times daily.    3. Are you having a reaction (difficulty breathing--STAT)? No  4. What is your medication issue?Patient would like to know if their is generic version of this medication.

## 2023-03-12 DIAGNOSIS — E039 Hypothyroidism, unspecified: Secondary | ICD-10-CM | POA: Diagnosis not present

## 2023-04-11 ENCOUNTER — Encounter: Payer: Self-pay | Admitting: Cardiology

## 2023-04-11 ENCOUNTER — Ambulatory Visit: Payer: Medicare Other | Admitting: Cardiology

## 2023-04-11 VITALS — BP 136/76 | HR 57 | Ht 67.0 in | Wt 158.6 lb

## 2023-04-11 DIAGNOSIS — I4819 Other persistent atrial fibrillation: Secondary | ICD-10-CM | POA: Diagnosis not present

## 2023-04-11 DIAGNOSIS — I5022 Chronic systolic (congestive) heart failure: Secondary | ICD-10-CM

## 2023-04-11 DIAGNOSIS — D6869 Other thrombophilia: Secondary | ICD-10-CM

## 2023-04-11 DIAGNOSIS — I10 Essential (primary) hypertension: Secondary | ICD-10-CM | POA: Diagnosis not present

## 2023-04-11 NOTE — Patient Instructions (Signed)
Medication Instructions:  Your physician recommends that you continue on your current medications as directed. Please refer to the Current Medication list given to you today.  *If you need a refill on your cardiac medications before your next appointment, please call your pharmacy*   Lab Work: None ordered   Testing/Procedures: None ordered   Follow-Up: At [facility], you and your health needs are our priority.  As part of our continuing mission to provide you with exceptional heart care, we have created designated Provider Care Teams.  These Care Teams include your primary Cardiologist (physician) and Advanced Practice Providers (APPs -  Physician Assistants and Nurse Practitioners) who all work together to provide you with the care you need, when you need it.  Your next appointment:   6 month(s)  The format for your next appointment:   In Person  Provider:   You will follow up in the Atrial Fibrillation Clinic located at [facility]. Your provider will be: Clint R. Fenton, PA-C  Or  Jon Suarez, PA  Thank you for choosing [facility]!!    Sherri Price, RN [phone]          

## 2023-04-11 NOTE — Progress Notes (Signed)
  Electrophysiology Office Note:   Date:  04/11/2023  ID:  Robert West, DOB October 09, 1950, MRN [id number]  Primary Cardiologist: Rollene Rotunda, MD Electrophysiologist: Regan Lemming, MD      History of Present Illness:   Robert West is a 72 y.o. male with h/o atrial fibrillation seen today for routine electrophysiology followup.  Since last being seen in our clinic the patient reports doing well.  He has noted only 1 episode of atrial fibrillation since his ablation.  He is able to do all of his daily activities.  Happy with his control.  he denies chest pain, palpitations, dyspnea, PND, orthopnea, nausea, vomiting, dizziness, syncope, edema, weight gain, or early satiety.      He has a history seen for hypertension, atrial fibrillation. He was initially seen in 2020 for atrial fibrillation. He has since worn a cardiac monitor that showed persistent atrial fibrillation. An echo done that showed an ejection fraction of 40 to 45%. He is currently on metoprolol.  Post atrial fibrillation ablation 01/09/2023.      Review of systems complete and found to be negative unless listed in HPI.   EP Information / Studies Reviewed:    EKG is ordered today. Personal review as below.        Risk Assessment/Calculations:    CHA2DS2-VASc Score = 3   This indicates a 3.2% annual risk of stroke. The patient's score is based upon: CHF History: 1 HTN History: 1 Diabetes History: 0 Stroke History: 0 Vascular Disease History: 0 Age Score: 1 Gender Score: 0             Physical Exam:   VS:  BP 136/76   Pulse (!) 57   Ht 5\' 7"  (1.702 m)   Wt 158 lb 9.6 oz (71.9 kg)   SpO2 98%   BMI 24.84 kg/m    Wt Readings from Last 3 Encounters:  04/11/23 158 lb 9.6 oz (71.9 kg)  02/06/23 159 lb 6.4 oz (72.3 kg)  01/09/23 158 lb (71.7 kg)     GEN: Well nourished, well developed in no acute distress NECK: No JVD; No carotid bruits CARDIAC: Regular rate and rhythm, no murmurs, rubs,  gallops RESPIRATORY:  Clear to auscultation without rales, wheezing or rhonchi  ABDOMEN: Soft, non-tender, non-distended EXTREMITIES:  No edema; No deformity   ASSESSMENT AND PLAN:    1.  Persistent atrial fibrillation: Status post ablation 01/09/2023.  Mains in sinus rhythm.  Saleem Coccia continue with current management.  2.  Secondary hypercoagulable state: Currently on Eliquis for atrial fibrillation  3.  Hypertension: Currently well-controlled  4.  Chronic systolic heart failure: Potentially due to a tachycardia mediated cardiomyopathy.  No obvious volume overload.  May repeat echo in the future but he wants to hold off for now.  Follow up with Afib Clinic in 6 months  Signed, Amauri Medellin Jorja Loa, MD

## 2023-05-05 ENCOUNTER — Other Ambulatory Visit: Payer: Self-pay | Admitting: Family Medicine

## 2023-05-05 DIAGNOSIS — N644 Mastodynia: Secondary | ICD-10-CM | POA: Diagnosis not present

## 2023-05-20 ENCOUNTER — Ambulatory Visit
Admission: RE | Admit: 2023-05-20 | Discharge: 2023-05-20 | Disposition: A | Discharge status: Home / Self Care | Payer: Medicare Other | Source: Ambulatory Visit | Attending: Family Medicine | Admitting: Family Medicine

## 2023-05-20 DIAGNOSIS — N62 Hypertrophy of breast: Secondary | ICD-10-CM | POA: Diagnosis not present

## 2023-05-20 DIAGNOSIS — N644 Mastodynia: Secondary | ICD-10-CM

## 2023-07-02 DIAGNOSIS — I10 Essential (primary) hypertension: Secondary | ICD-10-CM | POA: Diagnosis not present

## 2023-07-02 DIAGNOSIS — I4891 Unspecified atrial fibrillation: Secondary | ICD-10-CM | POA: Diagnosis not present

## 2023-07-02 DIAGNOSIS — Z0000 Encounter for general adult medical examination without abnormal findings: Secondary | ICD-10-CM | POA: Diagnosis not present

## 2023-07-02 DIAGNOSIS — I5022 Chronic systolic (congestive) heart failure: Secondary | ICD-10-CM | POA: Diagnosis not present

## 2023-07-02 DIAGNOSIS — D6869 Other thrombophilia: Secondary | ICD-10-CM | POA: Diagnosis not present

## 2023-07-02 DIAGNOSIS — E0590 Thyrotoxicosis, unspecified without thyrotoxic crisis or storm: Secondary | ICD-10-CM | POA: Diagnosis not present

## 2023-07-02 DIAGNOSIS — E039 Hypothyroidism, unspecified: Secondary | ICD-10-CM | POA: Diagnosis not present

## 2023-07-02 DIAGNOSIS — E7800 Pure hypercholesterolemia, unspecified: Secondary | ICD-10-CM | POA: Diagnosis not present

## 2023-09-19 DIAGNOSIS — H401232 Low-tension glaucoma, bilateral, moderate stage: Secondary | ICD-10-CM | POA: Diagnosis not present

## 2023-09-23 ENCOUNTER — Other Ambulatory Visit: Payer: Self-pay | Admitting: Cardiology

## 2023-09-23 NOTE — Telephone Encounter (Signed)
Prescription refill request for Eliquis received. Indication:afib Last office visit:8/24 Scr:1.51  5/24 Age: 73 Weight:71.9  kg  Prescription refilled

## 2023-10-10 ENCOUNTER — Ambulatory Visit (HOSPITAL_COMMUNITY)
Admission: RE | Admit: 2023-10-10 | Discharge: 2023-10-10 | Disposition: A | Discharge status: Home / Self Care | Payer: Medicare Other | Source: Ambulatory Visit | Attending: Physician Assistant | Admitting: Physician Assistant

## 2023-10-10 VITALS — BP 190/100 | HR 61 | Ht 67.0 in | Wt 160.6 lb

## 2023-10-10 DIAGNOSIS — I110 Hypertensive heart disease with heart failure: Secondary | ICD-10-CM | POA: Insufficient documentation

## 2023-10-10 DIAGNOSIS — Z7901 Long term (current) use of anticoagulants: Secondary | ICD-10-CM | POA: Insufficient documentation

## 2023-10-10 DIAGNOSIS — I5020 Unspecified systolic (congestive) heart failure: Secondary | ICD-10-CM | POA: Insufficient documentation

## 2023-10-10 DIAGNOSIS — I4819 Other persistent atrial fibrillation: Secondary | ICD-10-CM | POA: Insufficient documentation

## 2023-10-10 DIAGNOSIS — D6869 Other thrombophilia: Secondary | ICD-10-CM | POA: Diagnosis not present

## 2023-10-10 DIAGNOSIS — I10 Essential (primary) hypertension: Secondary | ICD-10-CM

## 2023-10-10 NOTE — Progress Notes (Signed)
 Primary Care Physician: Irven Coe, MD Primary Cardiologist: Rollene Rotunda, MD Electrophysiologist: Will Jorja Loa, MD  Referring Physician: Dr Jamas Lav is a 73 y.o. male with a history of HTN, HFrEF, and atrial fibrillation who presents for follow up in the [facility]. He was initially seen in 2020 for atrial fibrillation. He has since worn a cardiac monitor that showed persistent atrial fibrillation. An echo done that showed an ejection fraction of 40 to 45%. Patient is on Eliquis for a CHADS2VASC score of 3. Seen by Dr Elberta Fortis and underwent afib ablation on 01/09/23. He did have some groin bruising and was seen by Landry Mellow PA on 6/3, felt to be normal post ablation convalescence, no imaging ordered at that time.   Patient returns for follow up for atrial fibrillation. Patient denies any interim symptoms of afib. About two weeks ago, he discontinued his Eliquis, metoprolol, and hydrochlorothiazide to "see if he really needed it". His BP is very elevated today.   Today, he denies symptoms of palpitations, chest pain, shortness of breath, orthopnea, PND, lower extremity edema, dizziness, presyncope, syncope, snoring, daytime somnolence, bleeding, or neurologic sequela. The patient is tolerating medications without difficulties and is otherwise without complaint today.    Atrial Fibrillation Risk Factors:  he does not have symptoms or diagnosis of sleep apnea. he does not have a history of rheumatic fever. The patient does not have a history of early familial atrial fibrillation or other arrhythmias.  Atrial Fibrillation Management history:  Previous antiarrhythmic drugs: none Previous cardioversions: none Previous ablations: 01/09/23 Anticoagulation history: Eliquis  ROS- All systems are reviewed and negative except as per the HPI above.   Physical Exam: BP (!) 190/100   Pulse 61   Ht 5\' 7"  (1.702 m)   Wt 72.8 kg   BMI 25.15  kg/m   GEN: Well nourished, well developed in no acute distress NECK: No JVD; No carotid bruits CARDIAC: Regular rate and rhythm, no murmurs, rubs, gallops RESPIRATORY:  Clear to auscultation without rales, wheezing or rhonchi  ABDOMEN: Soft, non-tender, non-distended EXTREMITIES:  No edema; No deformity    Wt Readings from Last 3 Encounters:  10/10/23 72.8 kg  04/11/23 71.9 kg  02/06/23 72.3 kg     EKG today demonstrates  SR Vent. rate 61 BPM PR interval 136 ms QRS duration 78 ms QT/QTcB 398/400 ms   Echo 09/04/22 demonstrated  1. Left ventricular ejection fraction, by estimation, is 40 to 45%. The  left ventricle has mildly decreased function. The left ventricle  demonstrates global hypokinesis. Left ventricular diastolic parameters are  indeterminate.   2. Right ventricular systolic function is normal. The right ventricular  size is normal. There is normal pulmonary artery systolic pressure.   3. The mitral valve is normal in structure. Moderate mitral valve  regurgitation. No evidence of mitral stenosis.   4. Tricuspid valve regurgitation is mild to moderate.   5. The aortic valve is normal in structure. Aortic valve regurgitation is  not visualized. No aortic stenosis is present.   6. The inferior vena cava is normal in size with greater than 50%  respiratory variability, suggesting right atrial pressure of 3 mmHg.    CHA2DS2-VASc Score = 3  The patient's score is based upon: CHF History: 1 HTN History: 1 Diabetes History: 0 Stroke History: 0 Vascular Disease History: 0 Age Score: 1 Gender Score: 0       ASSESSMENT AND PLAN: Persistent Atrial Fibrillation (  ICD10:  I48.19) The patient's CHA2DS2-VASc score is 3, indicating a 3.2% annual risk of stroke.   S/p afib ablation 01/09/23 Patient appears to be maintaining SR Continue Eliquis 5 mg BID. Stressed importance of taking medication for stroke prevention. He will resume it.  Continue Lopressor 50 mg  BID  Secondary Hypercoagulable State (ICD10:  D68.69) The patient is at significant risk for stroke/thromboembolism based upon his CHA2DS2-VASc Score of 3.  Continue Apixaban (Eliquis).   HFmrEF EF 40-45% GDMT per primary cardiology team Fluid status appears stable today  HTN Very elevated today Resume hydrochlorothiazide and metoprolol.  Stressed importance of taking medications as prescribed.    Follow up with Dr Antoine Poche in 6 months. AF clinic in one year.    Jorja Loa PA-C Afib Clinic [facility] [address] [place], [place] [address] [phone]

## 2023-11-04 DIAGNOSIS — K080 Exfoliation of teeth due to systemic causes: Secondary | ICD-10-CM | POA: Diagnosis not present

## 2023-11-28 ENCOUNTER — Other Ambulatory Visit: Payer: Self-pay | Admitting: Cardiology

## 2023-12-29 DIAGNOSIS — E0590 Thyrotoxicosis, unspecified without thyrotoxic crisis or storm: Secondary | ICD-10-CM | POA: Diagnosis not present

## 2023-12-29 DIAGNOSIS — E7800 Pure hypercholesterolemia, unspecified: Secondary | ICD-10-CM | POA: Diagnosis not present

## 2023-12-29 DIAGNOSIS — I10 Essential (primary) hypertension: Secondary | ICD-10-CM | POA: Diagnosis not present

## 2023-12-31 DIAGNOSIS — E7800 Pure hypercholesterolemia, unspecified: Secondary | ICD-10-CM | POA: Diagnosis not present

## 2023-12-31 DIAGNOSIS — I10 Essential (primary) hypertension: Secondary | ICD-10-CM | POA: Diagnosis not present

## 2023-12-31 DIAGNOSIS — I4891 Unspecified atrial fibrillation: Secondary | ICD-10-CM | POA: Diagnosis not present

## 2023-12-31 DIAGNOSIS — E0590 Thyrotoxicosis, unspecified without thyrotoxic crisis or storm: Secondary | ICD-10-CM | POA: Diagnosis not present

## 2024-02-19 ENCOUNTER — Ambulatory Visit: Admitting: Podiatry

## 2024-02-19 ENCOUNTER — Encounter: Payer: Self-pay | Admitting: Podiatry

## 2024-02-19 DIAGNOSIS — B351 Tinea unguium: Secondary | ICD-10-CM | POA: Diagnosis not present

## 2024-02-19 MED ORDER — NEOMYCIN-POLYMYXIN-HC 3.5-10000-1 OT SUSP: OTIC | 0 refills | Status: DC

## 2024-02-19 NOTE — Patient Instructions (Signed)

## 2024-02-22 ENCOUNTER — Encounter: Payer: Self-pay | Admitting: Podiatry

## 2024-02-22 NOTE — Progress Notes (Signed)
  Subjective:  Patient ID: Robert West, male    DOB: [birth year],  MRN: [id number]  Chief Complaint  Patient presents with   Ingrown Toenail    Left foot great toe lateral border. 6 pain with pressure. Non diabetic. Last matrixectomy on left foot done 06/21/21.    73 y.o. male presents with the above complaint. History confirmed with patient.  Has noticed increasing discoloration and bad texture of the nails.  Wants to know if they can be removed permanently  Objective:  Physical Exam: warm, good capillary refill, no trophic changes or ulcerative lesions, normal DP and PT pulses, normal sensory exam, and onychomycosis.  Assessment:   1. Onychomycosis      Plan:  Patient was evaluated and treated and all questions answered.     Mycotic Nail, bilaterally -Patient elects to proceed with minor surgery to remove Mycotic toenail today. Consent reviewed and signed by patient. -Mycotic nail excised. See procedure note. -Educated on post-procedure care including soaking. Written instructions provided and reviewed. -Rx for Cortisporin sent to pharmacy. -Advised on signs and symptoms of infection developing.  We discussed that the phenol likely will create some redness and edema and tenderness around the nailbed as long as it is localized this is to be expected.  Will return as needed if any infection signs develop  Procedure: Excision of Mycotic Toenail Location: Bilateral 1st toe  nail Anesthesia: Lidocaine  1% plain1.5 mL and Marcaine 0.5% plain; 1.5 mL, digital block. Skin Prep: Betadine. Dressing: Silvadene; telfa; dry, sterile, compression dressing. Technique: Following skin prep, the toe was exsanguinated and a tourniquet was secured at the base of the toe. The affected nail was freed, and excised. Chemical matrixectomy was then performed with phenol and irrigated out with alcohol. The tourniquet was then removed and sterile dressing applied. Disposition: Patient tolerated procedure  well.    Return if symptoms worsen or fail to improve.

## 2024-04-02 DIAGNOSIS — H25813 Combined forms of age-related cataract, bilateral: Secondary | ICD-10-CM | POA: Diagnosis not present

## 2024-04-02 DIAGNOSIS — H401232 Low-tension glaucoma, bilateral, moderate stage: Secondary | ICD-10-CM | POA: Diagnosis not present

## 2024-04-02 DIAGNOSIS — H5319 Other subjective visual disturbances: Secondary | ICD-10-CM | POA: Diagnosis not present

## 2024-04-02 DIAGNOSIS — H35033 Hypertensive retinopathy, bilateral: Secondary | ICD-10-CM | POA: Diagnosis not present

## 2024-04-06 DIAGNOSIS — I5022 Chronic systolic (congestive) heart failure: Secondary | ICD-10-CM | POA: Insufficient documentation

## 2024-04-06 NOTE — Progress Notes (Unsigned)
 Cardiology Office Note:   Date:  04/08/2024  ID:  Robert West, DOB 10/30/1950, MRN [id number] PCP: Leonel Cole, MD  [facility] Providers Cardiologist:  Lynwood Schilling, MD Electrophysiologist:  Will Gladis Norton, MD {  History of Present Illness:   Robert West is a 73 y.o. male with a history of HTN, HFrEF, and atrial fibrillation who presents for follow up in the [facility]. He was initially seen in 2020 for atrial fibrillation. He has since worn a cardiac monitor that showed persistent atrial fibrillation. An echo done that showed an ejection fraction of 40 to 45%. Patient is on Eliquis  for a CHADS2VASC score of 3. Seen by Dr Norton and underwent afib ablation on 01/09/23. He did have some groin bruising and was seen by Thom Heinrich PA on 6/3, felt to be normal post ablation convalescence, no imaging ordered at that time.    He returns for follow up for atrial fibrillation.  Since his ablation he has not had any recurrence.  He does not feel palpitations but he never really did.  He denies any presyncope or syncope.  He had no chest pressure, neck or arm discomfort.  He had a shortness of breath, PND or orthopnea.  ROS: As stated in the HPI and negative for all other systems.  Studies Reviewed:    EKG:   EKG Interpretation Date/Time:  Thursday April 08 2024 09:03:43 EDT Ventricular Rate:  46 PR Interval:  134 QRS Duration:  84 QT Interval:  434 QTC Calculation: 379 R Axis:   3  Text Interpretation: Sinus bradycardia Non-specific ST-t changes When compared with ECG of 10-Oct-2023 10:29, No significant change since last tracing rate is slower Confirmed by Schilling Lynwood ([id number]) on 04/08/2024 9:09:10 AM    Risk Assessment/Calculations:    CHA2DS2-VASc Score = 3   This indicates a 3.2% annual risk of stroke. The patient's score is based upon: CHF History: 1 HTN History: 1 Diabetes History: 0 Stroke History: 0 Vascular Disease History: 0 Age  Score: 1 Gender Score: 0    Physical Exam:   VS:  BP 130/68 (BP Location: Left Arm, Patient Position: Sitting, Cuff Size: Normal)   Pulse (!) 46   Ht 5' 7 (1.702 m)   Wt 157 lb 6.4 oz (71.4 kg)   SpO2 98%   BMI 24.65 kg/m    Wt Readings from Last 3 Encounters:  04/08/24 157 lb 6.4 oz (71.4 kg)  10/10/23 160 lb 9.6 oz (72.8 kg)  04/11/23 158 lb 9.6 oz (71.9 kg)     GEN: Well nourished, well developed in no acute distress NECK: No JVD; No carotid bruits CARDIAC: RRR, no murmurs, rubs, gallops RESPIRATORY:  Clear to auscultation without rales, wheezing or rhonchi  ABDOMEN: Soft, non-tender, non-distended EXTREMITIES:  No edema; No deformity   ASSESSMENT AND PLAN:   Persistent Atrial Fibrillation (ICD10:  I48.19): We had a long discussion about the risk benefits of discontinuing anticoagulation.  He has not had any symptomatic recurrence.  He would like to come off the Eliquis .  I think this is reasonable if he has continued monitoring and I have suggested an Apple watch.  He understands the risk benefits of this approach.  He would prefer to be off the Eliquis .  HFmrEF:  EF was 40 - 45%.  He is not on goal-directed medical therapy.  He had a cough with ACE inhibitors.  I think he came off the valsartan  when there was a problem getting it  through the manufacture with some recall.  I would recheck an echocardiogram and if his ejection fraction is low he is going to need Entresto.   HTN: This is gena be managed in context of managing his cardiomyopathy if his ejection fraction remains low.  Follow up in the atrial fibrillation clinic in about 6 months to reassess the above strategy  Signed, Lynwood Schilling, MD

## 2024-04-08 ENCOUNTER — Ambulatory Visit: Attending: Cardiology | Admitting: Cardiology

## 2024-04-08 ENCOUNTER — Encounter: Payer: Self-pay | Admitting: Cardiology

## 2024-04-08 VITALS — BP 130/68 | HR 46 | Ht 67.0 in | Wt 157.4 lb

## 2024-04-08 DIAGNOSIS — I4819 Other persistent atrial fibrillation: Secondary | ICD-10-CM

## 2024-04-08 DIAGNOSIS — I5022 Chronic systolic (congestive) heart failure: Secondary | ICD-10-CM | POA: Diagnosis not present

## 2024-04-08 DIAGNOSIS — I429 Cardiomyopathy, unspecified: Secondary | ICD-10-CM

## 2024-04-08 DIAGNOSIS — D6869 Other thrombophilia: Secondary | ICD-10-CM | POA: Diagnosis not present

## 2024-04-08 DIAGNOSIS — I10 Essential (primary) hypertension: Secondary | ICD-10-CM | POA: Diagnosis not present

## 2024-04-08 MED ORDER — ASPIRIN 81 MG PO TBEC: 81.0000 mg | DELAYED_RELEASE_TABLET | Freq: Every day | ORAL | Status: DC

## 2024-04-08 NOTE — Patient Instructions (Addendum)
 Medication Instructions:  Stop Eliquis  Start Aspirin  81 mg once daily *If you need a refill on your cardiac medications before your next appointment, please call your pharmacy*  Lab Work: NONE If you have labs (blood work) drawn today and your tests are completely normal, you will receive your results only by: MyChart Message (if you have MyChart) OR A paper copy in the mail If you have any lab test that is abnormal or we need to change your treatment, we will call you to review the results.  Testing/Procedures: Echocardiogram Your physician has requested that you have an echocardiogram. Echocardiography is a painless test that uses sound waves to create images of your heart. It provides your doctor with information about the size and shape of your heart and how well your heart's chambers and valves are working. This procedure takes approximately one hour. There are no restrictions for this procedure. Please do NOT wear cologne, perfume, aftershave, or lotions (deodorant is allowed). Please arrive 15 minutes prior to your appointment time.  Please note: We ask at that you not bring children with you during ultrasound (echo/ vascular) testing. Due to room size and safety concerns, children are not allowed in the ultrasound rooms during exams. Our front office staff cannot provide observation of children in our lobby area while testing is being conducted. An adult accompanying a patient to their appointment will only be allowed in the ultrasound room at the discretion of the ultrasound technician under special circumstances. We apologize for any inconvenience.   Follow-Up: At [facility], you and your health needs are our priority.  As part of our continuing mission to provide you with exceptional heart care, our providers are all part of one team.  This team includes your primary Cardiologist (physician) and Advanced Practice Providers or APPs (Physician Assistants and Nurse  Practitioners) who all work together to provide you with the care you need, when you need it.  Your next appointment:   6 months in afib clinic  We recommend signing up for the patient portal called MyChart.  Sign up information is provided on this After Visit Summary.  MyChart is used to connect with patients for Virtual Visits (Telemedicine).  Patients are able to view lab/test results, encounter notes, upcoming appointments, etc.  Non-urgent messages can be sent to your provider as well.   To learn more about what you can do with MyChart, go to [email or web address].

## 2024-04-13 ENCOUNTER — Encounter: Payer: Self-pay | Admitting: Sports Medicine

## 2024-04-28 ENCOUNTER — Encounter: Payer: Self-pay | Admitting: Cardiology

## 2024-05-12 ENCOUNTER — Encounter: Payer: Self-pay | Admitting: Cardiology

## 2024-05-13 ENCOUNTER — Encounter: Payer: Self-pay | Admitting: Cardiology

## 2024-05-14 ENCOUNTER — Ambulatory Visit (HOSPITAL_COMMUNITY)
Admission: RE | Admit: 2024-05-14 | Discharge: 2024-05-14 | Disposition: A | Discharge status: Home / Self Care | Source: Ambulatory Visit | Attending: Cardiology | Admitting: Cardiology

## 2024-05-14 DIAGNOSIS — I080 Rheumatic disorders of both mitral and aortic valves: Secondary | ICD-10-CM | POA: Insufficient documentation

## 2024-05-14 DIAGNOSIS — I428 Other cardiomyopathies: Secondary | ICD-10-CM

## 2024-05-14 DIAGNOSIS — I429 Cardiomyopathy, unspecified: Secondary | ICD-10-CM | POA: Insufficient documentation

## 2024-05-14 LAB — ECHOCARDIOGRAM COMPLETE
Area-P 1/2: 2.73 cm2
S' Lateral: 2.8 cm

## 2024-05-15 ENCOUNTER — Ambulatory Visit: Payer: Self-pay | Admitting: Cardiology

## 2024-05-15 DIAGNOSIS — I10 Essential (primary) hypertension: Secondary | ICD-10-CM

## 2024-05-18 MED ORDER — SACUBITRIL-VALSARTAN 24-26 MG PO TABS: 1.0000 | ORAL_TABLET | Freq: Two times a day (BID) | ORAL | 3 refills | Status: AC

## 2024-05-18 NOTE — Telephone Encounter (Signed)
 Spoke to pt regarding results. Pt agreeable to plan. Prescriptions sent. Labs ordered and released. Pt verbalized understanding. All questions if any were answered. Results sent to PCP.

## 2024-05-18 NOTE — Telephone Encounter (Signed)
-----   Message from Lynwood Schilling sent at 05/15/2024  9:11 PM EDT ----- EF is still mildly low.  Please start Entresto 24/26 mg PO BID and   Please check a BMET in 10 days after starting the Entresto.   ----- Message ----- From: Interface, [employer] Sent: 05/14/2024   3:53 PM EDT To: Lynwood Schilling, MD

## 2024-05-19 ENCOUNTER — Encounter: Payer: Self-pay | Admitting: Emergency Medicine

## 2024-05-19 NOTE — Telephone Encounter (Signed)
 Letter sent by MyChart

## 2024-05-20 NOTE — Telephone Encounter (Signed)
 Last read by Tod Levee at 7:52PM on 05/19/2024.

## 2024-06-01 ENCOUNTER — Encounter: Payer: Self-pay | Admitting: Cardiology

## 2024-06-05 ENCOUNTER — Encounter: Payer: Self-pay | Admitting: Cardiology

## 2024-06-09 NOTE — Telephone Encounter (Signed)
 Pt notified of suggestions via MyChart. Last read by Tod Levee at 8:49PM on 06/07/2024.

## 2024-06-28 DIAGNOSIS — K219 Gastro-esophageal reflux disease without esophagitis: Secondary | ICD-10-CM | POA: Diagnosis not present

## 2024-06-28 DIAGNOSIS — E7800 Pure hypercholesterolemia, unspecified: Secondary | ICD-10-CM | POA: Diagnosis not present

## 2024-06-28 DIAGNOSIS — Z0000 Encounter for general adult medical examination without abnormal findings: Secondary | ICD-10-CM | POA: Diagnosis not present

## 2024-06-28 DIAGNOSIS — I10 Essential (primary) hypertension: Secondary | ICD-10-CM | POA: Diagnosis not present

## 2024-06-28 DIAGNOSIS — I4891 Unspecified atrial fibrillation: Secondary | ICD-10-CM | POA: Diagnosis not present

## 2024-06-28 DIAGNOSIS — E0590 Thyrotoxicosis, unspecified without thyrotoxic crisis or storm: Secondary | ICD-10-CM | POA: Diagnosis not present

## 2024-07-15 ENCOUNTER — Encounter: Payer: Self-pay | Admitting: Cardiology

## 2024-07-21 ENCOUNTER — Other Ambulatory Visit: Payer: Self-pay | Admitting: Cardiology

## 2024-08-24 ENCOUNTER — Encounter: Payer: Self-pay | Admitting: Cardiology

## 2024-08-27 ENCOUNTER — Telehealth: Payer: Self-pay

## 2024-08-27 ENCOUNTER — Ambulatory Visit (HOSPITAL_COMMUNITY)
Admission: RE | Admit: 2024-08-27 | Discharge: 2024-08-27 | Disposition: A | Discharge status: Home / Self Care | Source: Ambulatory Visit | Attending: Nurse Practitioner | Admitting: Nurse Practitioner

## 2024-08-27 ENCOUNTER — Encounter (HOSPITAL_COMMUNITY): Payer: Self-pay | Admitting: Nurse Practitioner

## 2024-08-27 VITALS — BP 104/60 | HR 49 | Ht 67.0 in | Wt 161.4 lb

## 2024-08-27 DIAGNOSIS — I5020 Unspecified systolic (congestive) heart failure: Secondary | ICD-10-CM | POA: Diagnosis not present

## 2024-08-27 DIAGNOSIS — D6869 Other thrombophilia: Secondary | ICD-10-CM

## 2024-08-27 DIAGNOSIS — I10 Essential (primary) hypertension: Secondary | ICD-10-CM

## 2024-08-27 DIAGNOSIS — I4819 Other persistent atrial fibrillation: Secondary | ICD-10-CM

## 2024-08-27 MED ORDER — APIXABAN 5 MG PO TABS: 5.0000 mg | ORAL_TABLET | Freq: Two times a day (BID) | ORAL | 6 refills | Status: AC

## 2024-08-27 NOTE — Progress Notes (Addendum)
 "  Primary Care Physician: Leonel Cole, MD Primary Cardiologist: Lynwood Schilling, MD Electrophysiologist: Soyla Gladis Norton, MD   @[email or web address] @   Robert West is a 74 y.o. male with a history of persistent AF (on Eliquis ),HFmrEF, HTN, mild to moderate MR, aortic atherosclerosis who presents for follow up in the [facility].  The patient was initially diagnosed with atrial fibrillation in 2020 and wore a event monitor that showed persistent AF.  He had a 2D echo completed that showed EF of 40 to 45% he was started on Eliquis  and metoprolol .  He was referred to EP and evaluated by Dr. Norton on 10/08/2022 and underwent AF ablation on 01/09/2023.  He was seen in follow-up by Daril Kicks, PA on 02/06/2023 and was maintaining sinus rhythm.  He was seen back by Dr. Norton on 04/11/23 and continued to do well with no recurrence of atrial fibrillation.  He was last seen on 82/28/25 for follow-up and patient had self discontinued his Eliquis , HCTZ, and metoprolol .  He was noted to have elevated BP during his visit and was maintaining sinus rhythm.  The importance of med compliance was stressed and patient was started back on metoprolol , HCTZ, and Eliquis .  He was seen by Dr. Schilling on 04/08/2024 for follow-up and expresses desire to discontinue his Eliquis  which was granted with the instruction to monitor for recurrence of A-fib with a Apple Watch.  He contacted our office on 08/24/2024 with complaint of elevated heart rate and exhaustion.  He included a rhythm strip in his MyChart message that appeared to show irregularities but was difficult to interpret in my opinion.  Robert West presents today with his wife for follow-up and recurrence of atrial fibrillation. He experiences significant fatigue and weakness in his legs, particularly noticeable when walking from his house to his barn. This episode occurred earlier in the week, specifically on Tuesday, when he also noted a high  heart rate. He sent his blood pressure readings and performed an EKG, which showed a fast and irregular heart rate, but the exact rhythm could not be determined. He uses a Galaxy watch to track his heart rate, which has shown irregularities. Since Tuesday, he reports feeling fine and has not experienced further significant fatigue.He is currently on metoprolol  50 mg twice a day. He has a history of atrial fibrillation and was previously on Eliquis , which he stopped. He has been physically active, recently engaging in holiday representative work, which he believes may have contributed to his symptoms. He stays hydrated and has reduced his coffee intake significantly. He does not consume alcohol or smoke. His sister had a procedure called pulse field ablation, which he is interested in learning more about. He has a history of atrial fibrillation ablation and is considering further intervention to manage his condition. Today, he denies symptoms of palpitations, chest pain, shortness of breath, orthopnea, PND, lower extremity edema, dizziness, presyncope, syncope, snoring, daytime somnolence, bleeding, or neurologic sequela. The patient is tolerating medications without difficulties and is otherwise without complaint today.    Atrial Fibrillation Management history: History of Sleep Apnea no Previous antiarrhythmic drugs: none Previous cardioversions: none Previous ablations: 01/09/23 Anticoagulation history: Eliquis   ROS- All systems are reviewed and negative except as per the HPI above.  Past Medical History:  Diagnosis Date   Benign essential hypertension 07/04/2014   GERD (gastroesophageal reflux disease)    Partial tear of subscapularis tendon 08/18/2014   Subclinical hyperthyroidism 07/06/2014   Past Surgical History:  Procedure Laterality Date  ATRIAL FIBRILLATION ABLATION N/A 01/09/2023   Procedure: ATRIAL FIBRILLATION ABLATION;  Surgeon: Inocencio Soyla Lunger, MD;  Location: [facility] IN[facility]AS[facility] CV [facility];   Service: Cardiovascular;  Laterality: N/A;   EYE SURGERY     Right eye/detached retina   TONSILLECTOMY     Ace inhibitors and Zofran [ondansetron hcl] Current Outpatient Medications  Medication Sig Dispense Refill   apixaban  (ELIQUIS ) 5 MG TABS tablet Take 1 tablet (5 mg total) by mouth 2 (two) times daily. 60 tablet 6   hydrochlorothiazide (HYDRODIURIL) 12.5 MG tablet Take 12.5 mg by mouth daily.     metoprolol  tartrate (LOPRESSOR ) 50 MG tablet TAKE 1 TABLET(50 MG) BY MOUTH TWICE DAILY 180 tablet 1   pantoprazole (PROTONIX) 40 MG tablet Take 40 mg by mouth as needed.     sacubitril -valsartan  (ENTRESTO ) 24-26 MG Take 1 tablet by mouth 2 (two) times daily. 180 tablet 3   No current facility-administered medications for this encounter.    Physical Exam: BP 104/60   Pulse (!) 49   Ht 5' 7 (1.702 m)   Wt 73.2 kg   BMI 25.28 kg/m   GEN: Well nourished, well developed in no acute distress NECK: No JVD; No carotid bruits CARDIAC: Regular rate and rhythm, no murmurs, rubs, gallops RESPIRATORY:  Clear to auscultation without rales, wheezing or rhonchi  ABDOMEN: Soft, non-tender, non-distended EXTREMITIES:  No edema; No deformity   Wt Readings from Last 3 Encounters:  08/27/24 73.2 kg  04/08/24 71.4 kg  10/10/23 72.8 kg     EKG today demonstrates:   EKG Interpretation Date/Time:  Friday August 27 2024 11:29:40 EST Ventricular Rate:  49 PR Interval:  134 QRS Duration:  84 QT Interval:  426 QTC Calculation: 384 R Axis:   19  Text Interpretation: Sinus bradycardia Otherwise normal ECG When compared with ECG of 08-Apr-2024 09:03, No significant change was found Confirmed by Wyn Manus [id number]) on 08/27/2024 12:22:02 PM        Echo Completed 06/10/2024:  1. Left ventricular ejection fraction, by estimation, is 45 to 50%. Left  ventricular ejection fraction by 3D volume is 55 %. The left ventricle has  mildly decreased function. The left ventricle has no regional wall  motion  abnormalities. Left ventricular   diastolic parameters are indeterminate. The average left ventricular  global longitudinal strain is -13.7 %. The global longitudinal strain is  abnormal.   2. Right ventricular systolic function is normal. The right ventricular  size is normal.   3. Left atrial size was mildly dilated.   4. Right atrial size was mild to moderately dilated.   5. The mitral valve is normal in structure. Mild to moderate mitral valve  regurgitation. No evidence of mitral stenosis.   6. The aortic valve is tricuspid. There is mild calcification of the  aortic valve. Aortic valve regurgitation is trivial. Aortic valve  sclerosis/calcification is present, without any evidence of aortic  stenosis.   7. The inferior vena cava is normal in size with greater than 50%  respiratory variability, suggesting right atrial pressure of 3 mmHg.   CHA2DS2-VASc Score = 3  The patient's score is based upon: CHF History: 1 HTN History: 1 Diabetes History: 0 Stroke History: 0 Vascular Disease History: 0 Age Score: 1 Gender Score: 0      ASSESSMENT AND PLAN: Persistent Atrial Fibrillation (ICD10:  I48.19) The patient's CHA2DS2-VASc score is 3, indicating a 3.2% annual risk of stroke.   -s/p AF ablation by Dr. Inocencio in 2024 with discontinuation  of Eliquis  through shared decision on 04/08/2024 Discussed pulse field ablation and Watchman device as long-term strategies to reduce stroke risk and avoid long-term anticoagulation.  -Discontinue ASA 81 mg - Restart Eliquis  5 mg twice daily. - Ordered CBC in one month to monitor for bleeding issues. - Referred to EP for discussion of repeat ablation and Watchman device. - Continue metoprolol  50 mg twice daily; consider reducing dose if fatigue worsens. - Educated on lifestyle modifications to avoid AFib triggers, including hydration and limiting caffeine and alcohol intake.  HFmrEF: - 2D echo completed recently showing mildly reduced  EF of 45-50% with no RWMA and mildly dilated LA and moderately dilated RA with mild to moderate MR  HTN: BP well controlled. Continue current antihypertensive regimen.    Secondary Hypercoagulable State (ICD10:  D68.69) The patient is at significant risk for stroke/thromboembolism based upon his CHA2DS2-VASc Score of 3.  Start Apixaban  (Eliquis ).  - Referred to EP for evaluation of Watchman device candidacy. - Educated on the importance of uninterrupted anticoagulation to prevent stroke.  Signed,  Wyn Raddle, Jackee Shove, NP    08/27/2024 12:25 PM     Follow up with the AF Clinic in 6 months    "

## 2024-08-27 NOTE — Patient Instructions (Signed)
 Start Eliquis  5 mg twice a day   Stop aspirin    You may go to any [facility] Location for your lab work: in one month 09/27/24  [facility] - [address] ([facility]) - [address] [phone] [address]  [place] - [address]   [place]  - [address]   [place] - [address] - [address] [place]  - [address] [place] - [address] ([facility]    If you have any lab test that is abnormal or we need to change your treatment, we will call you

## 2024-08-27 NOTE — Telephone Encounter (Signed)
 Received consult to discuss repeat ablation and watchman procedure from Jackee Alberts. Arranged OV with Dr. Almetta 2/12 at 11:15 AM.

## 2024-09-01 ENCOUNTER — Telehealth: Payer: Self-pay

## 2024-09-01 NOTE — Telephone Encounter (Signed)
 Distal filling defect noted on best systolic images. Delayed images fill well. (see screenshot) Max 22/ AVG 21/ Depth 17+ Likely use a 27mm device  Inf/Mid TSP RAO 19 CAU 17

## 2024-09-09 ENCOUNTER — Ambulatory Visit (HOSPITAL_COMMUNITY): Payer: Self-pay | Admitting: Nurse Practitioner

## 2024-09-09 LAB — CBC
Hematocrit: 39.6 % (ref 37.5–51.0)
Hemoglobin: 13.2 g/dL (ref 13.0–17.7)
MCH: 32.6 pg (ref 26.6–33.0)
MCHC: 33.3 g/dL (ref 31.5–35.7)
MCV: 98 fL — ABNORMAL HIGH (ref 79–97)
Platelets: 217 10*3/uL (ref 150–450)
RBC: 4.05 x10E6/uL — ABNORMAL LOW (ref 4.14–5.80)
RDW: 13.1 % (ref 11.6–15.4)
WBC: 7.6 10*3/uL (ref 3.4–10.8)

## 2024-09-17 ENCOUNTER — Encounter: Payer: Self-pay | Admitting: Cardiology

## 2024-09-23 ENCOUNTER — Ambulatory Visit: Admitting: Student in an Organized Health Care Education/Training Program

## 2024-10-11 ENCOUNTER — Ambulatory Visit (HOSPITAL_COMMUNITY): Admitting: Physician Assistant

## 2025-02-24 ENCOUNTER — Ambulatory Visit (HOSPITAL_COMMUNITY): Admitting: Nurse Practitioner
